# Patient Record
Sex: Female | Born: 1975 | Race: White | Hispanic: No | Marital: Married | Smoking: Never smoker
Health system: Southern US, Community
[De-identification: ages and names within clinical notes are randomized; demographics above are authoritative.]

## PROBLEM LIST (undated history)

## (undated) DIAGNOSIS — Z789 Other specified health status: Secondary | ICD-10-CM

## (undated) HISTORY — PX: NO PAST SURGERIES: SHX2092

---

## 2004-05-25 ENCOUNTER — Other Ambulatory Visit: Admission: RE | Admit: 2004-05-25 | Discharge: 2004-05-25 | Payer: Self-pay | Admitting: Obstetrics and Gynecology

## 2005-04-06 ENCOUNTER — Encounter: Admission: RE | Admit: 2005-04-06 | Discharge: 2005-04-06 | Payer: Self-pay | Admitting: Family Medicine

## 2005-09-06 ENCOUNTER — Other Ambulatory Visit: Admission: RE | Admit: 2005-09-06 | Discharge: 2005-09-06 | Payer: Self-pay | Admitting: Obstetrics and Gynecology

## 2006-09-12 ENCOUNTER — Other Ambulatory Visit: Admission: RE | Admit: 2006-09-12 | Discharge: 2006-09-12 | Payer: Self-pay | Admitting: Obstetrics & Gynecology

## 2007-03-24 ENCOUNTER — Encounter: Admission: RE | Admit: 2007-03-24 | Discharge: 2007-03-24 | Payer: Self-pay | Admitting: Family Medicine

## 2007-09-28 ENCOUNTER — Other Ambulatory Visit: Admission: RE | Admit: 2007-09-28 | Discharge: 2007-09-28 | Payer: Self-pay | Admitting: Obstetrics and Gynecology

## 2009-04-24 ENCOUNTER — Inpatient Hospital Stay (HOSPITAL_COMMUNITY): Admission: AD | Admit: 2009-04-24 | Discharge: 2009-04-27 | Payer: Self-pay | Admitting: Obstetrics and Gynecology

## 2010-09-29 ENCOUNTER — Ambulatory Visit (HOSPITAL_COMMUNITY): Admission: RE | Admit: 2010-09-29 | Discharge: 2010-09-29 | Payer: Self-pay | Admitting: Obstetrics and Gynecology

## 2010-10-05 ENCOUNTER — Ambulatory Visit (HOSPITAL_COMMUNITY): Admission: RE | Admit: 2010-10-05 | Discharge: 2010-10-05 | Payer: Self-pay | Admitting: Obstetrics and Gynecology

## 2010-11-03 ENCOUNTER — Ambulatory Visit (HOSPITAL_COMMUNITY)
Admission: RE | Admit: 2010-11-03 | Discharge: 2010-11-03 | Payer: Self-pay | Source: Home / Self Care | Admitting: Obstetrics and Gynecology

## 2011-03-23 LAB — CBC
HCT: 21.4 % — ABNORMAL LOW (ref 36.0–46.0)
HCT: 34.8 % — ABNORMAL LOW (ref 36.0–46.0)
Hemoglobin: 12.3 g/dL (ref 12.0–15.0)
Hemoglobin: 7.6 g/dL — CL (ref 12.0–15.0)
MCHC: 35.2 g/dL (ref 30.0–36.0)
MCHC: 35.3 g/dL (ref 30.0–36.0)
MCV: 92.6 fL (ref 78.0–100.0)
MCV: 93.3 fL (ref 78.0–100.0)
Platelets: 143 10*3/uL — ABNORMAL LOW (ref 150–400)
Platelets: 182 10*3/uL (ref 150–400)
RBC: 2.3 MIL/uL — ABNORMAL LOW (ref 3.87–5.11)
RBC: 3.76 MIL/uL — ABNORMAL LOW (ref 3.87–5.11)
RDW: 12.9 % (ref 11.5–15.5)
RDW: 13.1 % (ref 11.5–15.5)
WBC: 9 10*3/uL (ref 4.0–10.5)
WBC: 9.7 10*3/uL (ref 4.0–10.5)

## 2011-03-23 LAB — RPR: RPR Ser Ql: NONREACTIVE

## 2011-04-02 ENCOUNTER — Inpatient Hospital Stay (HOSPITAL_COMMUNITY)
Admission: RE | Admit: 2011-04-02 | Discharge: 2011-04-04 | DRG: 775 | Disposition: A | Discharge status: Home / Self Care | Payer: 59 | Source: Ambulatory Visit | Attending: Obstetrics and Gynecology | Admitting: Obstetrics and Gynecology

## 2011-04-02 LAB — CBC
HCT: 38 % (ref 36.0–46.0)
Hemoglobin: 12.7 g/dL (ref 12.0–15.0)
MCH: 30.2 pg (ref 26.0–34.0)
MCHC: 33.4 g/dL (ref 30.0–36.0)
MCV: 90.5 fL (ref 78.0–100.0)
Platelets: 174 10*3/uL (ref 150–400)
RDW: 13.1 % (ref 11.5–15.5)
WBC: 8.3 10*3/uL (ref 4.0–10.5)

## 2011-04-02 LAB — RPR: RPR Ser Ql: NONREACTIVE

## 2011-04-03 LAB — CBC
Hemoglobin: 11 g/dL — ABNORMAL LOW (ref 12.0–15.0)
MCHC: 32.8 g/dL (ref 30.0–36.0)
MCV: 90.8 fL (ref 78.0–100.0)
Platelets: 152 10*3/uL (ref 150–400)
RBC: 3.69 MIL/uL — ABNORMAL LOW (ref 3.87–5.11)
RDW: 13.1 % (ref 11.5–15.5)
WBC: 9.6 10*3/uL (ref 4.0–10.5)

## 2014-06-03 ENCOUNTER — Other Ambulatory Visit: Payer: Self-pay | Admitting: Family

## 2014-06-03 ENCOUNTER — Ambulatory Visit
Admission: RE | Admit: 2014-06-03 | Discharge: 2014-06-03 | Disposition: A | Discharge status: Home / Self Care | Payer: 59 | Source: Ambulatory Visit | Attending: Family | Admitting: Family

## 2014-06-03 DIAGNOSIS — M25512 Pain in left shoulder: Secondary | ICD-10-CM

## 2015-01-16 LAB — OB RESULTS CONSOLE RPR: RPR: NONREACTIVE

## 2015-01-16 LAB — OB RESULTS CONSOLE HEPATITIS B SURFACE ANTIGEN: Hepatitis B Surface Ag: NEGATIVE

## 2015-01-16 LAB — OB RESULTS CONSOLE RUBELLA ANTIBODY, IGM: Rubella: NON-IMMUNE/NOT IMMUNE

## 2015-01-16 LAB — OB RESULTS CONSOLE GC/CHLAMYDIA
CHLAMYDIA, DNA PROBE: NEGATIVE
Gonorrhea: NEGATIVE

## 2015-01-16 LAB — OB RESULTS CONSOLE ABO/RH: RH Type: POSITIVE

## 2015-01-16 LAB — OB RESULTS CONSOLE ANTIBODY SCREEN: Antibody Screen: NEGATIVE

## 2015-01-16 LAB — OB RESULTS CONSOLE HIV ANTIBODY (ROUTINE TESTING): HIV: NONREACTIVE

## 2015-05-01 ENCOUNTER — Inpatient Hospital Stay (HOSPITAL_COMMUNITY): Admission: AD | Admit: 2015-05-01 | Payer: Self-pay | Source: Ambulatory Visit | Admitting: Obstetrics and Gynecology

## 2015-08-15 ENCOUNTER — Encounter (HOSPITAL_COMMUNITY): Payer: Self-pay | Admitting: *Deleted

## 2015-08-15 ENCOUNTER — Telehealth (HOSPITAL_COMMUNITY): Payer: Self-pay | Admitting: *Deleted

## 2015-08-15 LAB — OB RESULTS CONSOLE GBS: GBS: NEGATIVE

## 2015-08-15 NOTE — Telephone Encounter (Signed)
Preadmission screen  

## 2015-08-21 ENCOUNTER — Encounter (HOSPITAL_COMMUNITY): Payer: Self-pay

## 2015-08-21 ENCOUNTER — Inpatient Hospital Stay (HOSPITAL_COMMUNITY)
Admission: AD | Admit: 2015-08-21 | Discharge: 2015-08-23 | DRG: 775 | Disposition: A | Discharge status: Home / Self Care | Payer: 59 | Source: Ambulatory Visit | Attending: Obstetrics and Gynecology | Admitting: Obstetrics and Gynecology

## 2015-08-21 DIAGNOSIS — Z809 Family history of malignant neoplasm, unspecified: Secondary | ICD-10-CM | POA: Diagnosis not present

## 2015-08-21 DIAGNOSIS — Z3A40 40 weeks gestation of pregnancy: Secondary | ICD-10-CM | POA: Diagnosis present

## 2015-08-21 DIAGNOSIS — Z8249 Family history of ischemic heart disease and other diseases of the circulatory system: Secondary | ICD-10-CM

## 2015-08-21 DIAGNOSIS — O480 Post-term pregnancy: Principal | ICD-10-CM | POA: Diagnosis present

## 2015-08-21 DIAGNOSIS — Z3490 Encounter for supervision of normal pregnancy, unspecified, unspecified trimester: Secondary | ICD-10-CM

## 2015-08-21 HISTORY — DX: Other specified health status: Z78.9

## 2015-08-21 LAB — CBC
HEMATOCRIT: 37.3 % (ref 36.0–46.0)
HEMOGLOBIN: 12.4 g/dL (ref 12.0–15.0)
MCH: 29.7 pg (ref 26.0–34.0)
MCHC: 33.2 g/dL (ref 30.0–36.0)
MCV: 89.2 fL (ref 78.0–100.0)
Platelets: 195 10*3/uL (ref 150–400)
RBC: 4.18 MIL/uL (ref 3.87–5.11)
RDW: 13.5 % (ref 11.5–15.5)
WBC: 8.9 10*3/uL (ref 4.0–10.5)

## 2015-08-21 LAB — TYPE AND SCREEN
ABO/RH(D): O POS
Antibody Screen: NEGATIVE

## 2015-08-21 LAB — RPR: RPR Ser Ql: NONREACTIVE

## 2015-08-21 LAB — ABO/RH: ABO/RH(D): O POS

## 2015-08-21 MED ORDER — ACETAMINOPHEN 325 MG PO TABS: 650.0000 mg | ORAL_TABLET | ORAL | Status: DC | PRN

## 2015-08-21 MED ORDER — SIMETHICONE 80 MG PO CHEW: 80.0000 mg | CHEWABLE_TABLET | ORAL | Status: DC | PRN

## 2015-08-21 MED ORDER — OXYTOCIN 40 UNITS IN LACTATED RINGERS INFUSION - SIMPLE MED
1.0000 m[IU]/min | INTRAVENOUS | Status: DC
  Administered 2015-08-21: 2 m[IU]/min via INTRAVENOUS

## 2015-08-21 MED ORDER — WITCH HAZEL-GLYCERIN EX PADS: 1.0000 "application " | MEDICATED_PAD | CUTANEOUS | Status: DC | PRN

## 2015-08-21 MED ORDER — ZOLPIDEM TARTRATE 5 MG PO TABS
5.0000 mg | ORAL_TABLET | Freq: Every evening | ORAL | Status: DC | PRN
Start: 2015-08-21 — End: 2015-08-23

## 2015-08-21 MED ORDER — DIPHENHYDRAMINE HCL 25 MG PO CAPS: 25.0000 mg | ORAL_CAPSULE | Freq: Four times a day (QID) | ORAL | Status: DC | PRN

## 2015-08-21 MED ORDER — LIDOCAINE HCL (PF) 1 % IJ SOLN
30.0000 mL | INTRAMUSCULAR | Status: DC | PRN
  Administered 2015-08-21: 30 mL via SUBCUTANEOUS
  Filled 2015-08-21: qty 30

## 2015-08-21 MED ORDER — OXYCODONE-ACETAMINOPHEN 5-325 MG PO TABS: 1.0000 | ORAL_TABLET | ORAL | Status: DC | PRN

## 2015-08-21 MED ORDER — ONDANSETRON HCL 4 MG/2ML IJ SOLN
4.0000 mg | INTRAMUSCULAR | Status: DC | PRN
Start: 2015-08-21 — End: 2015-08-23

## 2015-08-21 MED ORDER — SENNOSIDES-DOCUSATE SODIUM 8.6-50 MG PO TABS
2.0000 | ORAL_TABLET | ORAL | Status: DC
  Administered 2015-08-22 – 2015-08-23 (×2): 2 via ORAL
  Filled 2015-08-21 (×2): qty 2

## 2015-08-21 MED ORDER — TERBUTALINE SULFATE 1 MG/ML IJ SOLN
0.2500 mg | Freq: Once | INTRAMUSCULAR | Status: DC | PRN
Start: 2015-08-21 — End: 2015-08-21
  Filled 2015-08-21: qty 1

## 2015-08-21 MED ORDER — LANOLIN HYDROUS EX OINT: TOPICAL_OINTMENT | CUTANEOUS | Status: DC | PRN

## 2015-08-21 MED ORDER — DIBUCAINE 1 % RE OINT: 1.0000 "application " | TOPICAL_OINTMENT | RECTAL | Status: DC | PRN

## 2015-08-21 MED ORDER — BENZOCAINE-MENTHOL 20-0.5 % EX AERO
1.0000 "application " | INHALATION_SPRAY | CUTANEOUS | Status: DC | PRN
  Administered 2015-08-21: 1 via TOPICAL
  Filled 2015-08-21: qty 56

## 2015-08-21 MED ORDER — LACTATED RINGERS IV SOLN: 500.0000 mL | INTRAVENOUS | Status: DC | PRN

## 2015-08-21 MED ORDER — TETANUS-DIPHTH-ACELL PERTUSSIS 5-2.5-18.5 LF-MCG/0.5 IM SUSP: 0.5000 mL | Freq: Once | INTRAMUSCULAR | Status: DC

## 2015-08-21 MED ORDER — PRENATAL MULTIVITAMIN CH
1.0000 | ORAL_TABLET | Freq: Every day | ORAL | Status: DC
Start: 2015-08-21 — End: 2015-08-23
  Administered 2015-08-22 – 2015-08-23 (×2): 1 via ORAL
  Filled 2015-08-21 (×2): qty 1

## 2015-08-21 MED ORDER — ONDANSETRON HCL 4 MG/2ML IJ SOLN: 4.0000 mg | Freq: Four times a day (QID) | INTRAMUSCULAR | Status: DC | PRN

## 2015-08-21 MED ORDER — OXYTOCIN 40 UNITS IN LACTATED RINGERS INFUSION - SIMPLE MED
62.5000 mL/h | INTRAVENOUS | Status: DC
Start: 2015-08-21 — End: 2015-08-21
  Filled 2015-08-21: qty 1000

## 2015-08-21 MED ORDER — MEDROXYPROGESTERONE ACETATE 150 MG/ML IM SUSP: 150.0000 mg | INTRAMUSCULAR | Status: DC | PRN

## 2015-08-21 MED ORDER — OXYTOCIN 10 UNIT/ML IJ SOLN
INTRAMUSCULAR | Status: AC
  Filled 2015-08-21: qty 1

## 2015-08-21 MED ORDER — BUTORPHANOL TARTRATE 1 MG/ML IJ SOLN: 1.0000 mg | INTRAMUSCULAR | Status: DC | PRN

## 2015-08-21 MED ORDER — ONDANSETRON HCL 4 MG PO TABS
4.0000 mg | ORAL_TABLET | ORAL | Status: DC | PRN
Start: 2015-08-21 — End: 2015-08-23

## 2015-08-21 MED ORDER — IBUPROFEN 600 MG PO TABS
600.0000 mg | ORAL_TABLET | Freq: Four times a day (QID) | ORAL | Status: DC
  Administered 2015-08-21 – 2015-08-23 (×9): 600 mg via ORAL
  Filled 2015-08-21 (×9): qty 1

## 2015-08-21 MED ORDER — MEASLES, MUMPS & RUBELLA VAC ~~LOC~~ INJ: 0.5000 mL | INJECTION | Freq: Once | SUBCUTANEOUS | Status: DC

## 2015-08-21 MED ORDER — CITRIC ACID-SODIUM CITRATE 334-500 MG/5ML PO SOLN: 30.0000 mL | ORAL | Status: DC | PRN

## 2015-08-21 MED ORDER — OXYTOCIN BOLUS FROM INFUSION
500.0000 mL | INTRAVENOUS | Status: DC
Start: 2015-08-21 — End: 2015-08-21

## 2015-08-21 MED ORDER — OXYTOCIN 10 UNIT/ML IJ SOLN
INTRAMUSCULAR | Status: AC
  Administered 2015-08-21: 10 [IU] via INTRAMUSCULAR
  Filled 2015-08-21: qty 1

## 2015-08-21 MED ORDER — OXYCODONE-ACETAMINOPHEN 5-325 MG PO TABS
2.0000 | ORAL_TABLET | ORAL | Status: DC | PRN
Start: 2015-08-21 — End: 2015-08-23

## 2015-08-21 MED ORDER — OXYCODONE-ACETAMINOPHEN 5-325 MG PO TABS: 2.0000 | ORAL_TABLET | ORAL | Status: DC | PRN

## 2015-08-21 MED ORDER — LACTATED RINGERS IV SOLN: INTRAVENOUS | Status: DC

## 2015-08-21 NOTE — Lactation Note (Addendum)
This note was copied from the chart of Christina Wilkerson. Lactation Consultation Note initial visit at 11 hours of age.  Mom reports good feeding with a little pain with last latch.  Pacifier noted in baby's mouth.  Discussed reasons to not offer pacifier at this time. Mom has experience with breastfeeding 2 older children 1 year each. Baby asleep in crib.  [facility] LC resources given and discussed.  Encouraged to feed with early cues on demand.  Early newborn behavior discussed.  Hand expression demonstrated by mom with colostrum visible.  Mom to call for assist as needed.    Patient Name: Christina Wilkerson [id number] Date: 08/21/2015 Reason for consult: Initial assessment   Maternal Data Has patient been taught Hand Expression?: Yes Does the patient have breastfeeding experience prior to this delivery?: Yes  Feeding    LATCH Score/Interventions                Intervention(s): Breastfeeding basics reviewed     Lactation Tools Discussed/Used     Consult Status Consult Status: PRN    Jannifer Rodney 08/21/2015, 10:46 PM

## 2015-08-21 NOTE — Progress Notes (Signed)
Called to pt room, c/c/+2 station Vertex crowning after pushing w/ 2 contractions After delivery of head, shoulder dystocia suspected  Pt not cooperative with maneuvers - clinched legs & buttocks while pulling up bed Shoulder and body delivered with help of nurses pulling back on legs and suprapubic pressure Placenta delivered spontaneous w/ 3VC 1st degree laceration repaired with 3-0 vicryl rapide Mom and baby stable - couplet care EBL approx 200cc, pending final RN eval

## 2015-08-21 NOTE — H&P (Signed)
Christina Wilkerson is a 39 y.o. female presenting for postdates IOL.  Pregnancy uncomplicated. \ History OB History    Gravida Para Term Preterm AB TAB SAB Ectopic Multiple Living   Past Medical History  Diagnosis Date  . Medical history non-contributory    Past Surgical History  Procedure Laterality Date  . No past surgeries     Family History: family history includes Cancer in her paternal grandfather; Heart attack in her father. Social History:  reports that she has never smoked. She does not have any smokeless tobacco history on file. She reports that she does not drink alcohol or use illicit drugs.   Prenatal Transfer Tool  Maternal Diabetes: No Genetic Screening: Normal Maternal Ultrasounds/Referrals: Normal Fetal Ultrasounds or other Referrals:  None Maternal Substance Abuse:  No Significant Maternal Medications:  None Significant Maternal Lab Results:  None Other Comments:  None  ROS  Dilation: 3 Exam by:: Dr. Renaldo Fiddler Blood pressure 120/83, pulse 96, temperature 97.9 F (36.6 C), temperature source Oral, resp. rate 18, height 5' 3.5" (1.613 m), weight 158 lb (71.668 kg), last menstrual period 11/11/2014. Exam Physical Exam  gen - NAD Abd - gravid, NT Ext - NT, no edema Pv - 3cm attempted AROM, no fluid return Prenatal labs: ABO, Rh: O/Positive/-- (02/04 0000) Antibody: Negative (02/04 0000) Rubella: Nonimmune (02/04 0000) RPR: Nonreactive (02/04 0000)  HBsAg: Negative (02/04 0000)  HIV: Non-reactive (02/04 0000)  GBS: Negative (09/02 0000)   Assessment/Plan: Admit Pitocin Exp mngt   Jareb Radoncic 08/21/2015, 8:09 AM

## 2015-08-22 LAB — CBC
HEMATOCRIT: 31.6 % — AB (ref 36.0–46.0)
Hemoglobin: 10.8 g/dL — ABNORMAL LOW (ref 12.0–15.0)
MCH: 30.4 pg (ref 26.0–34.0)
MCHC: 34.2 g/dL (ref 30.0–36.0)
MCV: 89 fL (ref 78.0–100.0)
PLATELETS: 163 10*3/uL (ref 150–400)
RBC: 3.55 MIL/uL — ABNORMAL LOW (ref 3.87–5.11)
RDW: 13.7 % (ref 11.5–15.5)
WBC: 10 10*3/uL (ref 4.0–10.5)

## 2015-08-22 MED ORDER — MEASLES, MUMPS & RUBELLA VAC ~~LOC~~ INJ
0.5000 mL | INJECTION | Freq: Once | SUBCUTANEOUS | Status: AC
  Administered 2015-08-23: 0.5 mL via SUBCUTANEOUS
  Filled 2015-08-22: qty 0.5

## 2015-08-22 NOTE — Progress Notes (Signed)
Post Partum Day 1 Subjective: no complaints, up ad lib, voiding and tolerating PO  Objective: Blood pressure 110/64, pulse 67, temperature 97.9 F (36.6 C), temperature source Oral, resp. rate 18, height 5' 3.5" (1.613 m), weight 158 lb (71.668 kg), last menstrual period 11/11/2014, SpO2 100 %, unknown if currently breastfeeding.  Physical Exam:  General: alert and cooperative Lochia: appropriate Uterine Fundus: firm Incision: healing well DVT Evaluation: No evidence of DVT seen on physical exam. Negative Homan's sign. No cords or calf tenderness. No significant calf/ankle edema.   Recent Labs  08/21/15 0820 08/22/15 0615  HGB 12.4 10.8*  HCT 37.3 31.6*    Assessment/Plan: Plan for discharge tomorrow and Circumcision prior to discharge   LOS: 1 day   CURTIS,CAROL G 08/22/2015, 7:01 AM

## 2015-08-23 NOTE — Lactation Note (Signed)
This note was copied from the chart of Christina Wilkerson. Lactation Consultation Note  Ex BF P3 but mother needed reminders about feeding baby. Baby crying for over half an hour.  Mother stated baby recently breastfed for 50 min.  Discussed cluster feeding.   Encouraged mother to feed baby.  She latched baby and he seemed content.  Sucks and some swallows observed. Mother's nipples pink.  Provided her w/ comfort gels.  Discussed applying ebm. Reviewed engorgement care and monitoring voids/stools.    Patient Name: Christina Wilkerson Date: 08/23/2015 Reason for consult: Follow-up assessment   Maternal Data    Feeding Feeding Type: Breast Fed Length of feed: 50 min  LATCH Score/Interventions Latch: Grasps breast easily, tongue down, lips flanged, rhythmical sucking.  Audible Swallowing: A few with stimulation  Type of Nipple: Everted at rest and after stimulation  Comfort (Breast/Nipple): Filling, red/small blisters or bruises, mild/mod discomfort  Problem noted: Mild/Moderate discomfort Interventions (Mild/moderate discomfort): Hand expression;Comfort gels  Hold (Positioning): No assistance needed to correctly position infant at breast.  LATCH Score: 8  Lactation Tools Discussed/Used     Consult Status Consult Status: Complete    Hardie Pulley 08/23/2015, 8:35 AM

## 2015-08-23 NOTE — Discharge Summary (Signed)
Obstetric Discharge Summary Reason for Admission: induction of labor Prenatal Procedures: none Intrapartum Procedures: spontaneous vaginal delivery Postpartum Procedures: none Complications-Operative and Postpartum: 1st degree perineal laceration HEMOGLOBIN  Date Value Ref Range Status  08/22/2015 10.8* 12.0 - 15.0 g/dL Final   HCT  Date Value Ref Range Status  08/22/2015 31.6* 36.0 - 46.0 % Final    Physical Exam:  General: alert, cooperative, appears stated age and no distress Lochia: appropriate Uterine Fundus: firm Incision: healing well DVT Evaluation: No evidence of DVT seen on physical exam.  Discharge Diagnoses: Term Pregnancy-delivered  Discharge Information: Date: 08/23/2015 Activity: pelvic rest Diet: routine Medications: None Condition: stable Instructions: refer to practice specific booklet Discharge to: home   Newborn Data: Live born female  Birth Weight: 8 lb 13.8 oz (4020 g) APGAR: 8, 9  Home with mother.  Saed Hudlow C 08/23/2015, 8:38 AM

## 2015-09-11 ENCOUNTER — Ambulatory Visit (HOSPITAL_COMMUNITY)
Admission: RE | Admit: 2015-09-11 | Discharge: 2015-09-11 | Disposition: A | Discharge status: Home / Self Care | Payer: 59 | Source: Ambulatory Visit | Attending: Obstetrics and Gynecology | Admitting: Obstetrics and Gynecology

## 2015-09-11 NOTE — Lactation Note (Signed)
Lactation Consult  Mother's reason for visit:  Sore nipples Visit Type:  Outpatient - Feeding assessment, sore nipples Appointment Notes:  Mom reports that she has had continued pain with breastfeeding since baby was born. She started [facility] 3 weeks ago which has helped. However with nursing Mom continues to report PS of 8-9 with latch in the mornings. This improves later in the day to PS of 5. Mom reports crease across nipples bilateral with nursing, the right nipple is cracked and has bleeding with nursing. She would like help with latch due to discomfort. Mom is experienced BF with 2 older children BF for 1 year each. Baby Emmitt is now 33 weeks old.  Consult:  Initial Lactation Consultant:  Alfred Levins  ________________________________________________________________________   Baby's Name: Eugenia Mcalpine Date of Birth: 08/21/2013 Pediatrician: [facility] - [place], and [place]  - Dr. Sharol Roussel is Emmitt's Peds Gender: female Gestational Age: [redacted]w[redacted]d (At Birth) Birth Weight: 8 lb 13.8 oz (4020 g) Weight at Discharge: Weight: 8 lb 5.9 oz (3795 g)Date of Discharge: 08/23/2015 Filed Weights   08/21/15 1137 08/22/15 0008 08/23/15 0000  Weight: 8 lb 13.8 oz (4020 g) 8 lb 12 oz (3969 g) 8 lb 5.9 oz (3795 g)   Last weight taken from location outside of [facility]: 09/08/15  8 lb. 13 oz Location:Pediatrician's office Weight today: 4192 gm/ 9 lb. 3.9 oz.      ________________________________________________________________________  Mother's Name: Cyd Silence Type of delivery:  SVB Breastfeeding Experience:  P3, BF 2 older children for 1 year Maternal Medical Conditions:  None Reported Maternal Medications: PNV, APNC  ________________________________________________________________________  Breastfeeding History (Post Discharge)  Frequency of breastfeeding:  Every 2-3 hours Duration of feeding:  20-30 minutes, both breasts most  feedings.   Patient does not supplement or pump.  Infant Intake and Output Assessment  Voids:  10+ in 24 hrs.  Color:  Clear yellow Stools:  5-6 in 24 hrs.  Color:  Yellow/Seedy  ________________________________________________________________________  Maternal Breast Assessment  Breast:  Soft Nipple:  Erect with scabs across both nipples, worse on right nipple. Aerola is red, inflamed.  Pain level:  8-9 Pain interventions:  All purpose nipple cream  _______________________________________________________________________ Feeding Assessment/Evaluation  Initial feeding assessment:  Infant's oral assessment:  Variance. LC notes thick, labial frenulum along with short posterior, lingual frenulum. Baby has heart shape to tongue with crying. On suck exam, lower gum ridge rubs LC finger. Sucking callous' along baby's lower lip.   Positioning:  Cradle Left breast  LATCH documentation: With latching in cradle hold, baby initially was shallow with latch, chewing to obtain depth. Once baby obtained good depth and lips were un-tucked baby demonstrated a good suckling rhythm. Mom's PS with initial latch was 5 improving to 3 as baby nursed. Good swallows noted. Nutritive suckling pattern observed. When baby came off the breast, compression line visible across Mom's nipple in spite of what appeared to be a good latch.   Pre-feed weight:  4192 g  (9 lb. 3.9 oz.) Post-feed weight:  4270 g (9 lb. 6.6 oz.) Amount transferred:  78 ml with nursing for 15 minutes.   Additional Feeding Assessment -   Infant's oral assessment:  Variance - See Above  Positioning:  Cross cradle Right breast  LATCH documentation: Using cross cradle hold, baby was a little fussy but was able to obtain more depth with initial latch. PS with initial latch was 9-10. Compression line visible across nipple at scabbed area, no bleeding.  This did not improve after baby was nursing for 2-3 minutes. Initiated #24 nipple  shield, after few minutes baby obtained good depth and demonstrated a good nutritive suckling pattern. PS improved to 5 then 3 using the nipple shield. Mom's nipple was round when baby came off the breast, no bleeding observed. Lips had to be un-tucked with initial latch.   Tools:  Nipple shield 24 mm Instructed on use and cleaning of tool:  Yes.    Pre-feed weight:  4270 g  (9 lb. 6.6 oz.) Post-feed weight:  4296 g (9 lb. 7.5 oz.) Amount transferred:  26 ml with nursing for 15-20 minutes  Total amount transferred:  104 ml  Baby is transferring milk well at the breast in spite of painful latch. LC feels the pain with nursing is due to the short, posterior lingual frenulum and labial frenulum.  Baby's lower gum was rubbing against LC finger with suck exam and Mom reports biting, chewing sensations at the breast, worse towards the end of the feeding. Mom reports continued compression line visible on her nipples. Baby is not consistently keeping his lips untucked with the feeding. Sucking callous' noted along lower lip.  Encouraged Mom to use cross cradle with latching baby instead of cradle hold so baby will not obtain shallow latch at the beginning of the feeding. Mom does not report burning pain with nursing now but did have symptoms of possible yeast at her breast prior to starting [facility]. This has improved. LC advised if does not continue to improve contact OB, may need Diflucan. NO evidence of yeast observed at this visit in Baby Emmitt's mouth.  Information given to research "tongue-tie", (Dr. Lockie Mola web site, Dr. Tretha Sciara contact information and web site). Advised to use nipple shield to latch till nipples heal. May need to pre-pump to get flow moving if baby fussy using the nipple shield. Be sure to see breast milk in the nipple shield.  OP lactation f/u scheduled for Friday, 09/19/15 at 0900.

## 2015-09-19 ENCOUNTER — Ambulatory Visit (HOSPITAL_COMMUNITY)
Admission: RE | Admit: 2015-09-19 | Discharge: 2015-09-19 | Disposition: A | Discharge status: Home / Self Care | Payer: 59 | Source: Ambulatory Visit | Attending: Obstetrics and Gynecology | Admitting: Obstetrics and Gynecology

## 2015-09-19 NOTE — Lactation Note (Addendum)
Lactation Consult  Mother's reason for visit: follow up for sore nipples and feeding assessment Visit Type: feeding assessment   Consult:  Follow-Up Lactation Consultant:  Stevan Born McCoy  ________________________________________________________________________  Christina Wilkerson has a 12 ounce gain in one week. Mother was given lots of support and praise  ________________________________________________________________________  Mother's Name: Christina Wilkerson Type of delivery:  vaginal del Breastfeeding Experience:  1 year with two other children Maternal Medical Conditions:  none Maternal Medications: Prenatal vits  ________________________________________________________________________  Breastfeeding History (Post Discharge)  Frequency of breastfeeding:  2-3 hours Duration of feeding: 20-30 total feeding    Infant Intake and Output Assessment  Voids:  8-10 in 24 hrs.  Color:  Clear yellow Stools:  5-6 in 24 hrs.  Color:  Yellow  ________________________________________________________________________  Maternal Breast Assessment  Breast:  Full Nipple:  Erect Pain level:  1 Pain interventions:  Bra and All purpose nipple cream  _______________________________________________________________________ Feeding Assessment/Evaluation: Mother latched infant on the left breast. Observed a shallow latch. Infants lips pursed. He has a difficult time sustaining the latch. Infant pops on and off but observed good milk transfer.   Initial feeding assessment:  Mother has been using a #24 nipple shield on the rt nipple due to painful nipples. Mother has also been using APNO for her nipples and they are much better. Her nipples are still slighly pink. She describes slight pain on the initial latch.  Mother offered the alternate breast and infant sustained latch for 15 mins without popping on and off.   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  Audible swallowing:  2 = Spontaneous and intermittent  Type of nipple:  2 = Everted at rest and after stimulation  Comfort (Breast/Nipple):  1 = Filling, red/small blisters or bruises, mild/mod discomfort  Hold (Positioning):  1 = Assistance needed to correctly position infant at breast and maintain latch  LATCH score:  8  Attached assessment:  Deep  Lips flanged:  Yes.  assist with flanging infants lips  Lips untucked:  Yes.    Suck assessment:  Nutritive     Pre-feed weight: 4496,9-14.6   Post-feed weight: 4562, 10-09  Amount transferred:  66 ml  Alternate breast  Pre-weight: 4562  Post weight: 4588 Amt transferred, 26 ml   Total amount transferred:  66 ml from left breast and 26 ml from Rt breast.  Mother advised to continue to breastfeed infant on cue Suggested nipple to nose technique for deeper latch Discussed protecting her nipples and her milk supply Follow up with Specialist for revision Discussed the use of stretching exercises after procedure.  Advised mother to follow up with [name] after procedure for feeding assessment.

## 2016-01-10 IMAGING — CR DG SHOULDER 2+V*L*
3 series · 3 of 3 positions shown · non-contrast
Comparison: None.

CLINICAL DATA: Left shoulder pain

EXAM:
LEFT SHOULDER - 2+ VIEW

[view not recorded (1 of 3)]
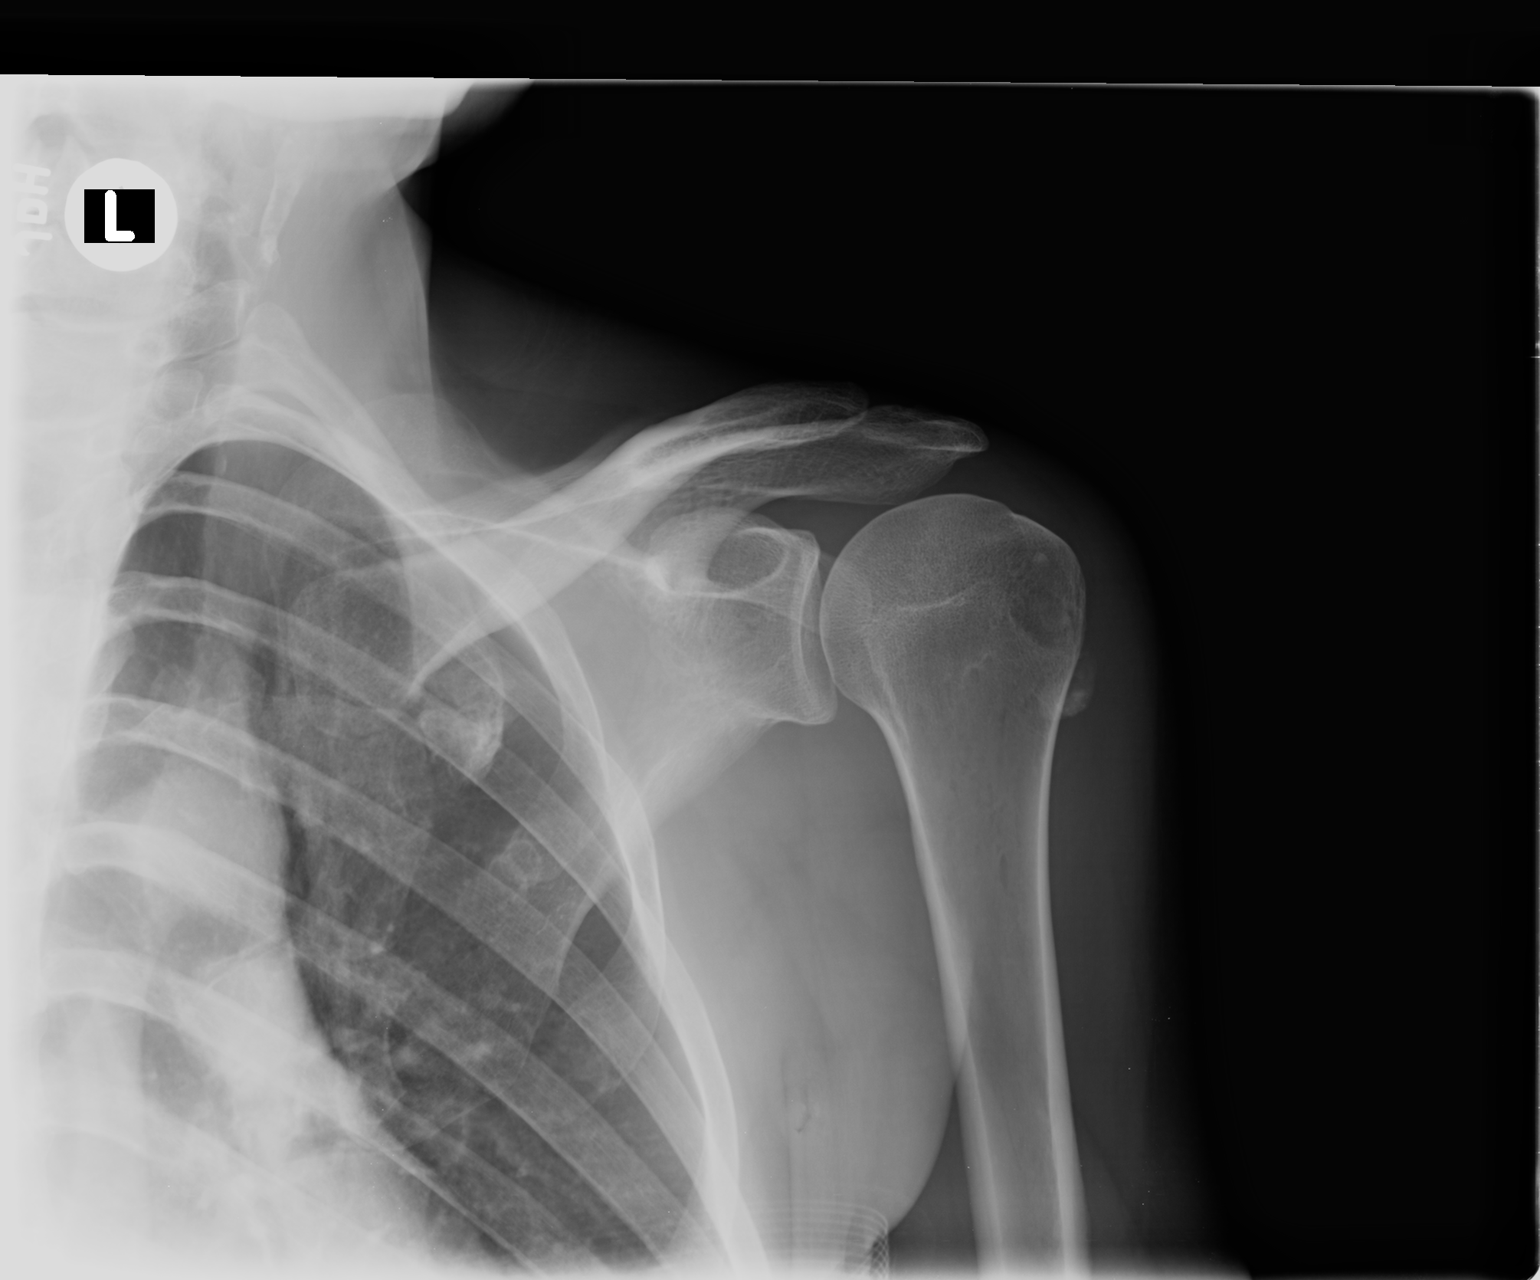

[view not recorded (2 of 3)]
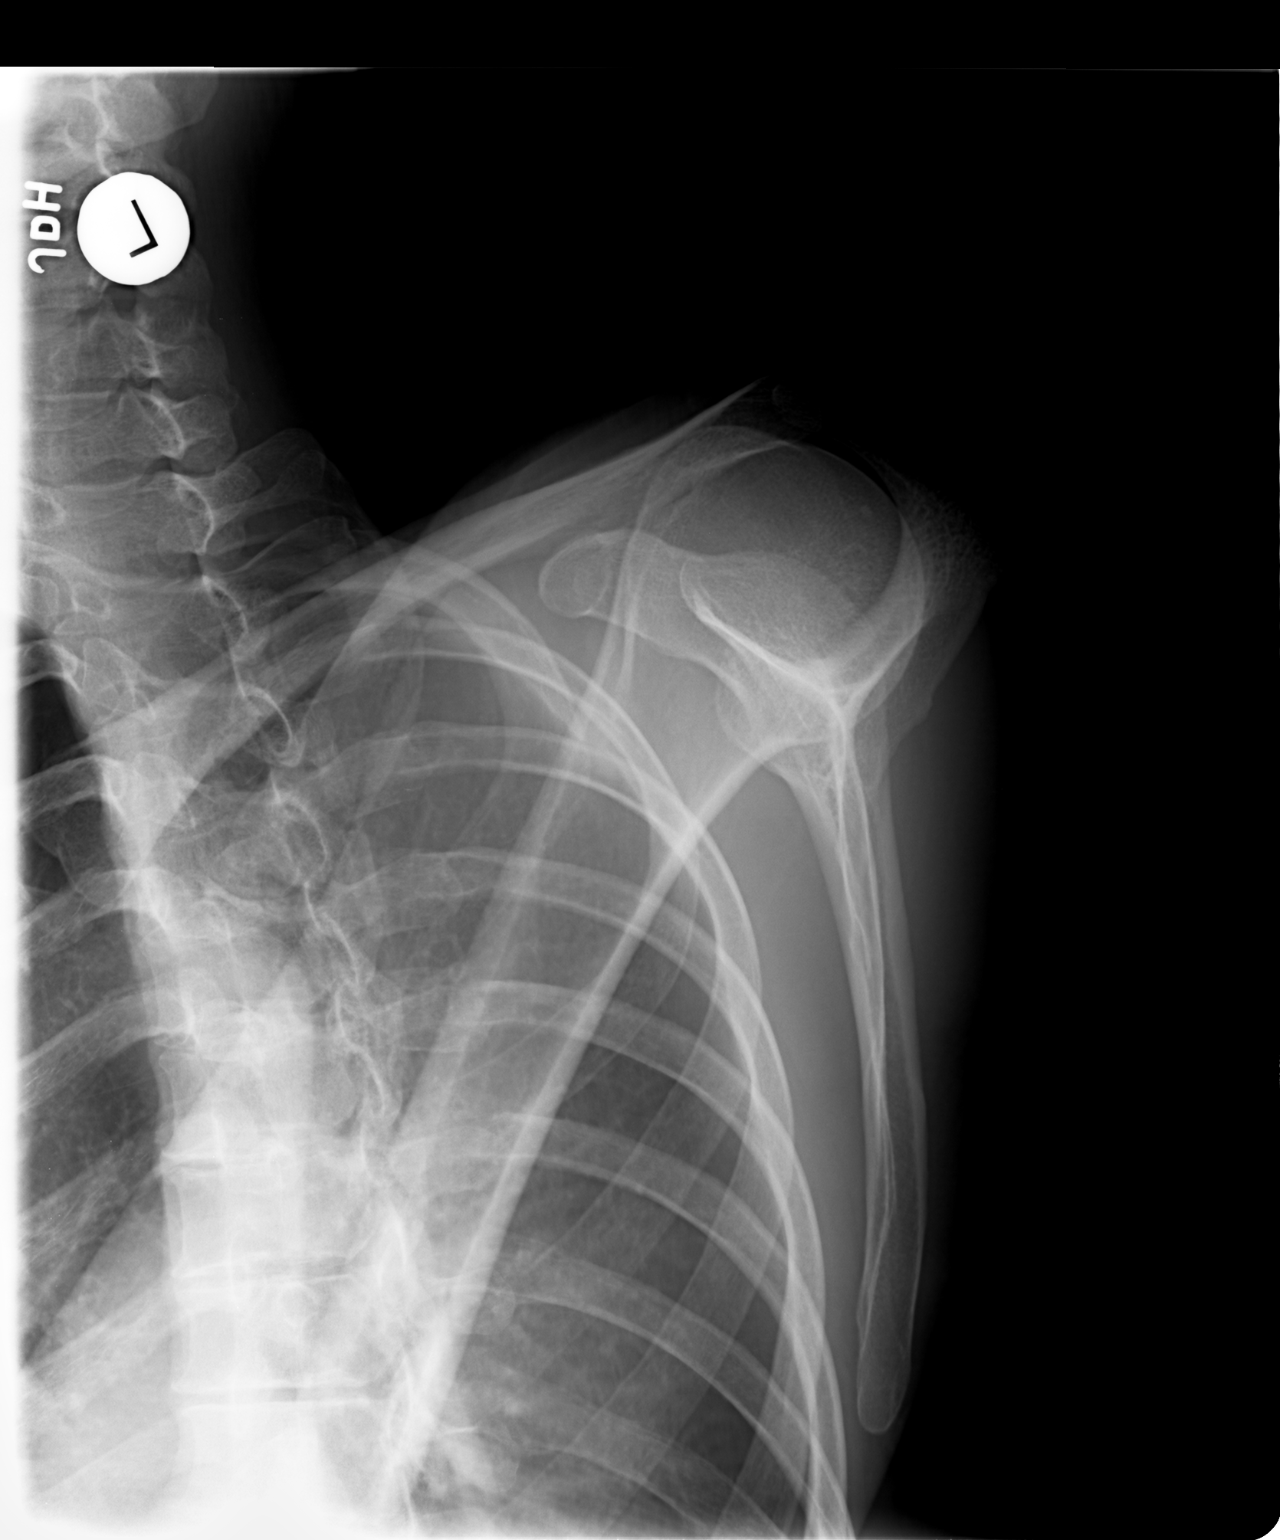

[view not recorded (3 of 3)]
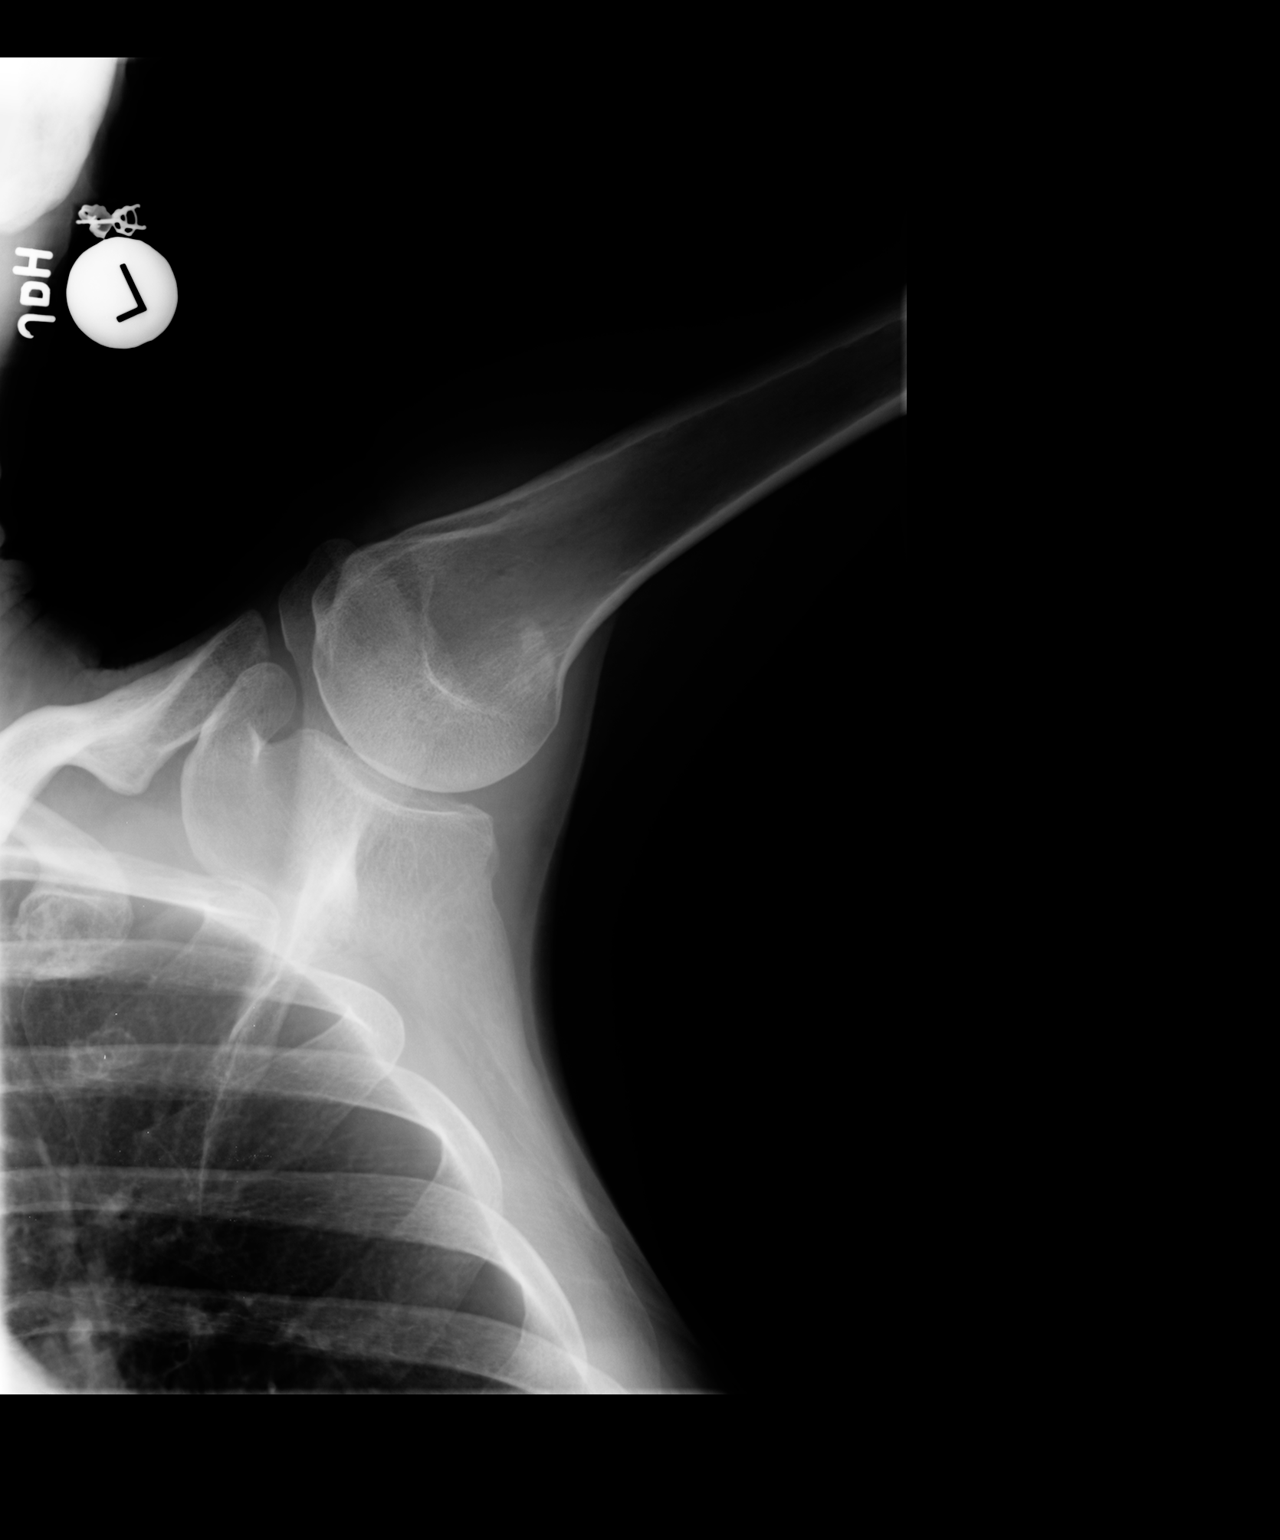

[3 of 3 positions shown; findings below may reference images not displayed]

FINDINGS: No acute fracture or dislocation is noted. No gross soft tissue
abnormality is seen. Bony densities noted adjacent to the humeral
head laterally. This may be related to calcification the joint
capsule or possibly associated with adjacent ligaments.
IMPRESSION: No acute abnormality noted.

## 2017-01-31 LAB — OB RESULTS CONSOLE RUBELLA ANTIBODY, IGM: RUBELLA: IMMUNE

## 2017-01-31 LAB — OB RESULTS CONSOLE ABO/RH: RH Type: POSITIVE

## 2017-01-31 LAB — OB RESULTS CONSOLE RPR: RPR: NONREACTIVE

## 2017-01-31 LAB — OB RESULTS CONSOLE GC/CHLAMYDIA
CHLAMYDIA, DNA PROBE: NEGATIVE
GC PROBE AMP, GENITAL: NEGATIVE

## 2017-01-31 LAB — OB RESULTS CONSOLE HEPATITIS B SURFACE ANTIGEN: Hepatitis B Surface Ag: NEGATIVE

## 2017-01-31 LAB — OB RESULTS CONSOLE ANTIBODY SCREEN: Antibody Screen: NEGATIVE

## 2017-01-31 LAB — OB RESULTS CONSOLE HIV ANTIBODY (ROUTINE TESTING): HIV: NONREACTIVE

## 2017-03-24 ENCOUNTER — Other Ambulatory Visit (HOSPITAL_COMMUNITY): Payer: Self-pay | Admitting: Obstetrics and Gynecology

## 2017-03-24 DIAGNOSIS — Z3689 Encounter for other specified antenatal screening: Secondary | ICD-10-CM

## 2017-03-24 DIAGNOSIS — O09522 Supervision of elderly multigravida, second trimester: Secondary | ICD-10-CM

## 2017-03-24 DIAGNOSIS — Z3A18 18 weeks gestation of pregnancy: Secondary | ICD-10-CM

## 2017-04-06 ENCOUNTER — Encounter (HOSPITAL_COMMUNITY): Payer: Self-pay

## 2017-04-06 ENCOUNTER — Ambulatory Visit (HOSPITAL_COMMUNITY): Payer: 59

## 2017-09-01 ENCOUNTER — Encounter (HOSPITAL_COMMUNITY): Payer: Self-pay | Admitting: *Deleted

## 2017-09-01 ENCOUNTER — Telehealth (HOSPITAL_COMMUNITY): Payer: Self-pay | Admitting: *Deleted

## 2017-09-01 LAB — OB RESULTS CONSOLE GBS: STREP GROUP B AG: NEGATIVE

## 2017-09-02 ENCOUNTER — Telehealth (HOSPITAL_COMMUNITY): Payer: Self-pay | Admitting: *Deleted

## 2017-09-02 NOTE — Telephone Encounter (Signed)
Preadmission screen  

## 2017-09-04 ENCOUNTER — Encounter (HOSPITAL_COMMUNITY): Payer: Self-pay | Admitting: Anesthesiology

## 2017-09-04 ENCOUNTER — Encounter (HOSPITAL_COMMUNITY): Payer: Self-pay | Admitting: *Deleted

## 2017-09-04 ENCOUNTER — Inpatient Hospital Stay (HOSPITAL_COMMUNITY)
Admission: AD | Admit: 2017-09-04 | Discharge: 2017-09-06 | DRG: 775 | Disposition: A | Discharge status: Home / Self Care | Payer: 59 | Source: Ambulatory Visit | Attending: Obstetrics and Gynecology | Admitting: Obstetrics and Gynecology

## 2017-09-04 DIAGNOSIS — Z3490 Encounter for supervision of normal pregnancy, unspecified, unspecified trimester: Secondary | ICD-10-CM

## 2017-09-04 DIAGNOSIS — Z3A40 40 weeks gestation of pregnancy: Secondary | ICD-10-CM | POA: Diagnosis not present

## 2017-09-04 DIAGNOSIS — O26893 Other specified pregnancy related conditions, third trimester: Secondary | ICD-10-CM | POA: Diagnosis present

## 2017-09-04 LAB — CBC
HCT: 36.6 % (ref 36.0–46.0)
Hemoglobin: 12.7 g/dL (ref 12.0–15.0)
MCH: 30.6 pg (ref 26.0–34.0)
MCHC: 34.7 g/dL (ref 30.0–36.0)
MCV: 88.2 fL (ref 78.0–100.0)
PLATELETS: 194 10*3/uL (ref 150–400)
RBC: 4.15 MIL/uL (ref 3.87–5.11)
RDW: 13.3 % (ref 11.5–15.5)
WBC: 7.3 10*3/uL (ref 4.0–10.5)

## 2017-09-04 LAB — TYPE AND SCREEN
ABO/RH(D): O POS
Antibody Screen: NEGATIVE

## 2017-09-04 LAB — RPR: RPR: NONREACTIVE

## 2017-09-04 LAB — POCT FERN TEST: POCT FERN TEST: POSITIVE

## 2017-09-04 MED ORDER — LACTATED RINGERS IV SOLN: 500.0000 mL | INTRAVENOUS | Status: DC | PRN

## 2017-09-04 MED ORDER — DIBUCAINE 1 % RE OINT: 1.0000 "application " | TOPICAL_OINTMENT | RECTAL | Status: DC | PRN

## 2017-09-04 MED ORDER — PHENYLEPHRINE 40 MCG/ML (10ML) SYRINGE FOR IV PUSH (FOR BLOOD PRESSURE SUPPORT)
80.0000 ug | PREFILLED_SYRINGE | INTRAVENOUS | Status: DC | PRN
Start: 2017-09-04 — End: 2017-09-04
  Filled 2017-09-04: qty 10

## 2017-09-04 MED ORDER — IBUPROFEN 600 MG PO TABS
600.0000 mg | ORAL_TABLET | Freq: Four times a day (QID) | ORAL | Status: DC
  Administered 2017-09-04 – 2017-09-06 (×9): 600 mg via ORAL
  Filled 2017-09-04 (×9): qty 1

## 2017-09-04 MED ORDER — ONDANSETRON HCL 4 MG/2ML IJ SOLN: 4.0000 mg | Freq: Four times a day (QID) | INTRAMUSCULAR | Status: DC | PRN

## 2017-09-04 MED ORDER — LIDOCAINE HCL (PF) 1 % IJ SOLN
30.0000 mL | INTRAMUSCULAR | Status: AC | PRN
  Administered 2017-09-04: 30 mL via SUBCUTANEOUS
  Filled 2017-09-04: qty 30

## 2017-09-04 MED ORDER — EPHEDRINE 5 MG/ML INJ: 10.0000 mg | INTRAVENOUS | Status: DC | PRN

## 2017-09-04 MED ORDER — ACETAMINOPHEN 325 MG PO TABS: 650.0000 mg | ORAL_TABLET | ORAL | Status: DC | PRN

## 2017-09-04 MED ORDER — OXYCODONE-ACETAMINOPHEN 5-325 MG PO TABS: 2.0000 | ORAL_TABLET | ORAL | Status: DC | PRN

## 2017-09-04 MED ORDER — DIPHENHYDRAMINE HCL 50 MG/ML IJ SOLN: 12.5000 mg | INTRAMUSCULAR | Status: DC | PRN

## 2017-09-04 MED ORDER — MEDROXYPROGESTERONE ACETATE 150 MG/ML IM SUSP: 150.0000 mg | INTRAMUSCULAR | Status: DC | PRN

## 2017-09-04 MED ORDER — EPHEDRINE 5 MG/ML INJ
10.0000 mg | INTRAVENOUS | Status: DC | PRN
Start: 2017-09-04 — End: 2017-09-04

## 2017-09-04 MED ORDER — MEASLES, MUMPS & RUBELLA VAC ~~LOC~~ INJ: 0.5000 mL | INJECTION | Freq: Once | SUBCUTANEOUS | Status: DC

## 2017-09-04 MED ORDER — ONDANSETRON HCL 4 MG/2ML IJ SOLN: 4.0000 mg | INTRAMUSCULAR | Status: DC | PRN

## 2017-09-04 MED ORDER — ONDANSETRON HCL 4 MG PO TABS: 4.0000 mg | ORAL_TABLET | ORAL | Status: DC | PRN

## 2017-09-04 MED ORDER — DIPHENHYDRAMINE HCL 25 MG PO CAPS: 25.0000 mg | ORAL_CAPSULE | Freq: Four times a day (QID) | ORAL | Status: DC | PRN

## 2017-09-04 MED ORDER — OXYCODONE-ACETAMINOPHEN 5-325 MG PO TABS: 1.0000 | ORAL_TABLET | ORAL | Status: DC | PRN

## 2017-09-04 MED ORDER — LACTATED RINGERS IV SOLN
INTRAVENOUS | Status: DC
  Administered 2017-09-04: 05:00:00 via INTRAVENOUS

## 2017-09-04 MED ORDER — PRENATAL MULTIVITAMIN CH
1.0000 | ORAL_TABLET | Freq: Every day | ORAL | Status: DC
  Administered 2017-09-04 – 2017-09-05 (×2): 1 via ORAL
  Filled 2017-09-04 (×2): qty 1

## 2017-09-04 MED ORDER — WITCH HAZEL-GLYCERIN EX PADS: 1.0000 "application " | MEDICATED_PAD | CUTANEOUS | Status: DC | PRN

## 2017-09-04 MED ORDER — SOD CITRATE-CITRIC ACID 500-334 MG/5ML PO SOLN: 30.0000 mL | ORAL | Status: DC | PRN

## 2017-09-04 MED ORDER — FLEET ENEMA 7-19 GM/118ML RE ENEM: 1.0000 | ENEMA | RECTAL | Status: DC | PRN

## 2017-09-04 MED ORDER — OXYTOCIN BOLUS FROM INFUSION
500.0000 mL | Freq: Once | INTRAVENOUS | Status: AC
  Administered 2017-09-04: 500 mL via INTRAVENOUS

## 2017-09-04 MED ORDER — SENNOSIDES-DOCUSATE SODIUM 8.6-50 MG PO TABS
2.0000 | ORAL_TABLET | ORAL | Status: DC
  Administered 2017-09-05 (×2): 2 via ORAL
  Filled 2017-09-04 (×2): qty 2

## 2017-09-04 MED ORDER — OXYCODONE-ACETAMINOPHEN 5-325 MG PO TABS
1.0000 | ORAL_TABLET | ORAL | Status: DC | PRN
Start: 2017-09-04 — End: 2017-09-06

## 2017-09-04 MED ORDER — TETANUS-DIPHTH-ACELL PERTUSSIS 5-2.5-18.5 LF-MCG/0.5 IM SUSP: 0.5000 mL | Freq: Once | INTRAMUSCULAR | Status: DC

## 2017-09-04 MED ORDER — COCONUT OIL OIL: 1.0000 "application " | TOPICAL_OIL | Status: DC | PRN

## 2017-09-04 MED ORDER — PHENYLEPHRINE 40 MCG/ML (10ML) SYRINGE FOR IV PUSH (FOR BLOOD PRESSURE SUPPORT): 80.0000 ug | PREFILLED_SYRINGE | INTRAVENOUS | Status: DC | PRN

## 2017-09-04 MED ORDER — OXYTOCIN 40 UNITS IN LACTATED RINGERS INFUSION - SIMPLE MED
2.5000 [IU]/h | INTRAVENOUS | Status: DC
  Administered 2017-09-04: 2.5 [IU]/h via INTRAVENOUS
  Filled 2017-09-04: qty 1000

## 2017-09-04 MED ORDER — OXYCODONE-ACETAMINOPHEN 5-325 MG PO TABS
2.0000 | ORAL_TABLET | ORAL | Status: DC | PRN
Start: 2017-09-04 — End: 2017-09-06

## 2017-09-04 MED ORDER — SIMETHICONE 80 MG PO CHEW: 80.0000 mg | CHEWABLE_TABLET | ORAL | Status: DC | PRN

## 2017-09-04 MED ORDER — FENTANYL 2.5 MCG/ML BUPIVACAINE 1/10 % EPIDURAL INFUSION (WH - ANES)
14.0000 mL/h | INTRAMUSCULAR | Status: DC | PRN
  Filled 2017-09-04: qty 100

## 2017-09-04 MED ORDER — BENZOCAINE-MENTHOL 20-0.5 % EX AERO: 1.0000 "application " | INHALATION_SPRAY | CUTANEOUS | Status: DC | PRN

## 2017-09-04 MED ORDER — LACTATED RINGERS IV SOLN: 500.0000 mL | Freq: Once | INTRAVENOUS | Status: DC

## 2017-09-04 NOTE — MAU Note (Signed)
SROM at 0300. Clear fld. 4cm last sve and hx rapid labor.

## 2017-09-04 NOTE — Lactation Note (Signed)
This note was copied from a baby's chart. Lactation Consultation Note  Patient Name: Boy Cassy Sprowl ZOXWR'U Date: 09/04/2017 Reason for consult: Initial assessment Baby at 9 hr of life. Upon entry "big brother" was holding baby. Family was spending time bonding. Experienced bf mom reports latching is going "well". She denies breast or nipple pain, voiced no concerns. Left lactation handouts and instructions for mom to call for lactation at the next feeding. No bf education was done at this visit.   Maternal Data    Feeding    LATCH Score                   Interventions    Lactation Tools Discussed/Used     Consult Status Consult Status: Follow-up Date: 09/05/17 Follow-up type: In-patient    Rulon Eisenmenger 09/04/2017, 3:57 PM

## 2017-09-04 NOTE — Anesthesia Preprocedure Evaluation (Deleted)
Anesthesia Evaluation  Patient identified by MRN, date of birth, ID band Patient awake    Reviewed: Allergy & Precautions, Patient's Chart, lab work & pertinent test results  Airway Mallampati: II  TM Distance: >3 FB Neck ROM: Full    Dental no notable dental hx. (+) Teeth Intact   Pulmonary neg pulmonary ROS,    Pulmonary exam normal breath sounds clear to auscultation       Cardiovascular negative cardio ROS Normal cardiovascular exam Rhythm:Regular Rate:Normal     Neuro/Psych negative neurological ROS  negative psych ROS   GI/Hepatic Neg liver ROS, GERD  ,  Endo/Other  negative endocrine ROS  Renal/GU negative Renal ROS  negative genitourinary   Musculoskeletal negative musculoskeletal ROS (+)   Abdominal   Peds  Hematology negative hematology ROS (+)   Anesthesia Other Findings   Reproductive/Obstetrics (+) Pregnancy Hx/o shoulder dystocia with previous pregnancy                             Anesthesia Physical Anesthesia Plan  ASA: II  Anesthesia Plan: Epidural   Post-op Pain Management:    Induction:   PONV Risk Score and Plan:   Airway Management Planned: Natural Airway  Additional Equipment:   Intra-op Plan:   Post-operative Plan:   Informed Consent: I have reviewed the patients History and Physical, chart, labs and discussed the procedure including the risks, benefits and alternatives for the proposed anesthesia with the patient or authorized representative who has indicated his/her understanding and acceptance.     Plan Discussed with: Anesthesiologist  Anesthesia Plan Comments: (Patient fully dilated and delivered before procedure. No procedure performed.)       Anesthesia Quick Evaluation

## 2017-09-04 NOTE — H&P (Signed)
Christina Wilkerson is a 41 y.o. female presenting for SROM/SOL.  No vb.  Pregnancy uncomplicted.  OB History    Gravida Para Term Preterm AB Living   SAB TAB Ectopic Multiple Live Births         0 3     Past Medical History:  Diagnosis Date  . Medical history non-contributory    Past Surgical History:  Procedure Laterality Date  . NO PAST SURGERIES     Family History: family history includes Cancer in her paternal grandfather; Heart attack in her father. Social History:  reports that she has never smoked. She has never used smokeless tobacco. She reports that she does not drink alcohol or use drugs.     Maternal Diabetes: No Genetic Screening: Abnormal:  Results: Other: increased DSR with quad - normal cell free fetal DNA Maternal Ultrasounds/Referrals: Normal Fetal Ultrasounds or other Referrals:  None Maternal Substance Abuse:  No Significant Maternal Medications:  None Significant Maternal Lab Results:  None Other Comments:  None  ROS History Dilation: 7.5 Blood pressure 125/73, pulse 100, temperature 98.2 F (36.8 C), temperature source Oral, resp. rate 18, height  (1.6 m), weight 165 lb (74.8 kg), last menstrual period 11/28/2016, unknown if currently breastfeeding. Exam Physical Exam  Gen- NAD Abd - gravid, NT Ext - no edema  Prenatal labs: ABO, Rh: O/Positive/-- (02/19 0000) Antibody: Negative (02/19 0000) Rubella: Immune (02/19 0000) RPR: Nonreactive (02/19 0000)  HBsAg: Negative (02/19 0000)  HIV: Non-reactive (02/19 0000)  GBS: Negative (09/20 0000)   Assessment/Plan: Admit    Christina Wilkerson 09/04/2017, 5:48 AM

## 2017-09-04 NOTE — Progress Notes (Signed)
To 164 from Triage via w/c

## 2017-09-04 NOTE — Progress Notes (Signed)
SVD of vigorous female infant w/ apgars of 8,9.  Placenta delivered spontaneous w/ 3VC.   keft vaginal sidewall lac repaired w/ 3-0 vicryl.  Fundus firm.  EBL 250cc .

## 2017-09-05 LAB — CBC
HEMATOCRIT: 32.4 % — AB (ref 36.0–46.0)
HEMOGLOBIN: 11 g/dL — AB (ref 12.0–15.0)
MCH: 30.6 pg (ref 26.0–34.0)
MCHC: 34 g/dL (ref 30.0–36.0)
MCV: 90 fL (ref 78.0–100.0)
Platelets: 158 10*3/uL (ref 150–400)
RBC: 3.6 MIL/uL — ABNORMAL LOW (ref 3.87–5.11)
RDW: 13.5 % (ref 11.5–15.5)
WBC: 8.5 10*3/uL (ref 4.0–10.5)

## 2017-09-05 LAB — BIRTH TISSUE RECOVERY COLLECTION (PLACENTA DONATION)

## 2017-09-05 NOTE — Lactation Note (Signed)
This note was copied from a baby's chart. Lactation Consultation Note  Patient Name: Christina Wilkerson ZOXWR'U Date: 09/05/2017 Reason for consult: Follow-up assessment  Mom says infant is "doing great." She has no questions or concerns.   Lurline Hare [facility] 09/05/2017, 5:43 PM

## 2017-09-05 NOTE — Progress Notes (Signed)
Post Partum Day 1 Subjective: no complaints, up ad lib, voiding and tolerating PO  Objective: Blood pressure 113/71, pulse 64, temperature 97.8 F (36.6 C), temperature source Oral, resp. rate 16, height  (1.6 m), weight 165 lb (74.8 kg), last menstrual period 11/28/2016, SpO2 100 %, unknown if currently breastfeeding.  Physical Exam:  General: alert, cooperative, appears stated age and no distress Lochia: appropriate Uterine Fundus: firm Incision: healing well DVT Evaluation: No evidence of DVT seen on physical exam.   Recent Labs  09/04/17 0517 09/05/17 0536  HGB 12.7 11.0*  HCT 36.6 32.4*    Assessment/Plan: Plan for discharge tomorrow, Breastfeeding and Circumcision prior to discharge   LOS: 1 day   Christina Wilkerson C 09/05/2017, 8:41 AM

## 2017-09-06 NOTE — Discharge Summary (Signed)
Obstetric Discharge Summary Reason for Admission: onset of labor Prenatal Procedures: none Intrapartum Procedures: spontaneous vaginal delivery Postpartum Procedures: none Complications-Operative and Postpartum: none Hemoglobin  Date Value Ref Range Status  09/05/2017 11.0 (L) 12.0 - 15.0 g/dL Final   HCT  Date Value Ref Range Status  09/05/2017 32.4 (L) 36.0 - 46.0 % Final    Physical Exam:  General: alert Lochia: appropriate Uterine Fundus: firm Incision: healing well DVT Evaluation: No evidence of DVT seen on physical exam.  Discharge Diagnoses: Term Pregnancy-delivered  Discharge Information: Date: 09/06/2017 Activity: pelvic rest Diet: routine Medications: PNV Condition: stable Instructions: refer to practice specific booklet Discharge to: home Follow-up Information    [place] Schedule an appointment as soon as possible for a visit in 6 week(s).   Contact information: [address] [place] [address] [phone]           Newborn Data: Live born female  Birth Weight: 8 lb 6.2 oz (3805 g) APGAR: 8, 9  Home with mother.  Christina Wilkerson M 09/06/2017, 8:30 AM

## 2017-09-09 ENCOUNTER — Encounter (HOSPITAL_COMMUNITY): Payer: 59

## 2020-08-04 LAB — OB RESULTS CONSOLE ABO/RH: RH Type: POSITIVE

## 2020-08-04 LAB — OB RESULTS CONSOLE GC/CHLAMYDIA
Chlamydia: NEGATIVE
Gonorrhea: NEGATIVE

## 2020-08-04 LAB — OB RESULTS CONSOLE ANTIBODY SCREEN: Antibody Screen: NEGATIVE

## 2020-08-04 LAB — OB RESULTS CONSOLE HIV ANTIBODY (ROUTINE TESTING): HIV: NONREACTIVE

## 2020-08-04 LAB — OB RESULTS CONSOLE HEPATITIS B SURFACE ANTIGEN: Hepatitis B Surface Ag: NEGATIVE

## 2020-08-04 LAB — OB RESULTS CONSOLE RUBELLA ANTIBODY, IGM: Rubella: IMMUNE

## 2020-08-04 LAB — OB RESULTS CONSOLE RPR: RPR: NONREACTIVE

## 2020-12-13 NOTE — L&D Delivery Note (Signed)
Delivery Note At 8:56 AM a viable female was delivered via Vaginal, Spontaneous (Presentation: Left Occiput Anterior).  APGAR: 8, 9; weight  .   Placenta status: Spontaneous, Intact.  Cord: 3 vessels with the following complications: None.  Cord pH: not ontained  Anesthesia: Local Episiotomy: None Lacerations: 2nd degree Suture Repair: 3.0 chromic Est. Blood Loss (mL): 300  Mom to postpartum.  Baby to Couplet care / Skin to Skin.  Jeani Hawking 02/06/2021, 9:10 AM

## 2021-01-12 LAB — OB RESULTS CONSOLE GBS: GBS: POSITIVE

## 2021-01-23 ENCOUNTER — Telehealth (HOSPITAL_COMMUNITY): Payer: Self-pay | Admitting: *Deleted

## 2021-01-23 ENCOUNTER — Encounter (HOSPITAL_COMMUNITY): Payer: Self-pay | Admitting: *Deleted

## 2021-01-23 NOTE — Telephone Encounter (Signed)
Preadmission screen  

## 2021-01-27 ENCOUNTER — Telehealth (HOSPITAL_COMMUNITY): Payer: Self-pay | Admitting: *Deleted

## 2021-01-27 NOTE — Telephone Encounter (Signed)
Preadmission screen  

## 2021-01-28 ENCOUNTER — Telehealth (HOSPITAL_COMMUNITY): Payer: Self-pay | Admitting: *Deleted

## 2021-01-28 NOTE — Telephone Encounter (Signed)
Preadmission screen  

## 2021-01-29 ENCOUNTER — Telehealth (HOSPITAL_COMMUNITY): Payer: Self-pay | Admitting: *Deleted

## 2021-01-29 ENCOUNTER — Encounter (HOSPITAL_COMMUNITY): Payer: Self-pay | Admitting: *Deleted

## 2021-01-29 NOTE — Telephone Encounter (Signed)
Preadmission screen  

## 2021-02-04 ENCOUNTER — Other Ambulatory Visit (HOSPITAL_COMMUNITY)
Admission: RE | Admit: 2021-02-04 | Discharge: 2021-02-04 | Disposition: A | Discharge status: Home / Self Care | Payer: 59 | Source: Ambulatory Visit | Attending: Obstetrics and Gynecology | Admitting: Obstetrics and Gynecology

## 2021-02-04 DIAGNOSIS — Z20822 Contact with and (suspected) exposure to covid-19: Secondary | ICD-10-CM | POA: Insufficient documentation

## 2021-02-04 DIAGNOSIS — Z01818 Encounter for other preprocedural examination: Secondary | ICD-10-CM | POA: Insufficient documentation

## 2021-02-04 LAB — SARS CORONAVIRUS 2 (TAT 6-24 HRS): SARS Coronavirus 2: NEGATIVE

## 2021-02-05 ENCOUNTER — Other Ambulatory Visit: Payer: Self-pay

## 2021-02-06 ENCOUNTER — Encounter (HOSPITAL_COMMUNITY): Payer: Self-pay | Admitting: Obstetrics and Gynecology

## 2021-02-06 ENCOUNTER — Inpatient Hospital Stay (HOSPITAL_COMMUNITY): Payer: 59

## 2021-02-06 ENCOUNTER — Inpatient Hospital Stay (HOSPITAL_COMMUNITY)
Admission: AD | Admit: 2021-02-06 | Discharge: 2021-02-08 | DRG: 807 | Disposition: A | Discharge status: Home / Self Care | Payer: 59 | Attending: Obstetrics and Gynecology | Admitting: Obstetrics and Gynecology

## 2021-02-06 DIAGNOSIS — Z3A40 40 weeks gestation of pregnancy: Secondary | ICD-10-CM

## 2021-02-06 DIAGNOSIS — Z20822 Contact with and (suspected) exposure to covid-19: Secondary | ICD-10-CM | POA: Diagnosis present

## 2021-02-06 DIAGNOSIS — O99824 Streptococcus B carrier state complicating childbirth: Principal | ICD-10-CM | POA: Diagnosis present

## 2021-02-06 DIAGNOSIS — O26893 Other specified pregnancy related conditions, third trimester: Secondary | ICD-10-CM | POA: Diagnosis present

## 2021-02-06 DIAGNOSIS — Z3490 Encounter for supervision of normal pregnancy, unspecified, unspecified trimester: Secondary | ICD-10-CM

## 2021-02-06 LAB — CBC
HCT: 32.6 % — ABNORMAL LOW (ref 36.0–46.0)
Hemoglobin: 10.8 g/dL — ABNORMAL LOW (ref 12.0–15.0)
MCH: 27.8 pg (ref 26.0–34.0)
MCHC: 33.1 g/dL (ref 30.0–36.0)
MCV: 84 fL (ref 80.0–100.0)
Platelets: 231 10*3/uL (ref 150–400)
RBC: 3.88 MIL/uL (ref 3.87–5.11)
RDW: 13.2 % (ref 11.5–15.5)
WBC: 8.7 10*3/uL (ref 4.0–10.5)
nRBC: 0 % (ref 0.0–0.2)

## 2021-02-06 LAB — TYPE AND SCREEN
ABO/RH(D): O POS
Antibody Screen: NEGATIVE

## 2021-02-06 LAB — RPR: RPR Ser Ql: NONREACTIVE

## 2021-02-06 MED ORDER — FLEET ENEMA 7-19 GM/118ML RE ENEM: 1.0000 | ENEMA | Freq: Every day | RECTAL | Status: DC | PRN

## 2021-02-06 MED ORDER — ACETAMINOPHEN 325 MG PO TABS: 650.0000 mg | ORAL_TABLET | ORAL | Status: DC | PRN

## 2021-02-06 MED ORDER — OXYCODONE HCL 5 MG PO TABS: 10.0000 mg | ORAL_TABLET | ORAL | Status: DC | PRN

## 2021-02-06 MED ORDER — IBUPROFEN 600 MG PO TABS
600.0000 mg | ORAL_TABLET | Freq: Four times a day (QID) | ORAL | Status: DC
  Administered 2021-02-06 – 2021-02-08 (×8): 600 mg via ORAL
  Filled 2021-02-06 (×9): qty 1

## 2021-02-06 MED ORDER — LACTATED RINGERS IV SOLN: 500.0000 mL | INTRAVENOUS | Status: DC | PRN

## 2021-02-06 MED ORDER — OXYCODONE HCL 5 MG PO TABS: 5.0000 mg | ORAL_TABLET | ORAL | Status: DC | PRN

## 2021-02-06 MED ORDER — WITCH HAZEL-GLYCERIN EX PADS: 1.0000 "application " | MEDICATED_PAD | CUTANEOUS | Status: DC | PRN

## 2021-02-06 MED ORDER — ONDANSETRON HCL 4 MG/2ML IJ SOLN: 4.0000 mg | Freq: Four times a day (QID) | INTRAMUSCULAR | Status: DC | PRN

## 2021-02-06 MED ORDER — OXYTOCIN-SODIUM CHLORIDE 30-0.9 UT/500ML-% IV SOLN
2.5000 [IU]/h | INTRAVENOUS | Status: DC
  Administered 2021-02-06: 2.5 [IU]/h via INTRAVENOUS

## 2021-02-06 MED ORDER — ONDANSETRON HCL 4 MG PO TABS: 4.0000 mg | ORAL_TABLET | ORAL | Status: DC | PRN

## 2021-02-06 MED ORDER — ONDANSETRON HCL 4 MG/2ML IJ SOLN: 4.0000 mg | INTRAMUSCULAR | Status: DC | PRN

## 2021-02-06 MED ORDER — OXYCODONE-ACETAMINOPHEN 5-325 MG PO TABS: 1.0000 | ORAL_TABLET | ORAL | Status: DC | PRN

## 2021-02-06 MED ORDER — TERBUTALINE SULFATE 1 MG/ML IJ SOLN: 0.2500 mg | Freq: Once | INTRAMUSCULAR | Status: DC | PRN

## 2021-02-06 MED ORDER — OXYCODONE-ACETAMINOPHEN 5-325 MG PO TABS: 2.0000 | ORAL_TABLET | ORAL | Status: DC | PRN

## 2021-02-06 MED ORDER — OXYTOCIN-SODIUM CHLORIDE 30-0.9 UT/500ML-% IV SOLN
1.0000 m[IU]/min | INTRAVENOUS | Status: DC
  Filled 2021-02-06: qty 500

## 2021-02-06 MED ORDER — TETANUS-DIPHTH-ACELL PERTUSSIS 5-2.5-18.5 LF-MCG/0.5 IM SUSY: 0.5000 mL | PREFILLED_SYRINGE | Freq: Once | INTRAMUSCULAR | Status: DC

## 2021-02-06 MED ORDER — SENNOSIDES-DOCUSATE SODIUM 8.6-50 MG PO TABS
2.0000 | ORAL_TABLET | Freq: Every day | ORAL | Status: DC
  Administered 2021-02-07: 2 via ORAL
  Filled 2021-02-06 (×2): qty 2

## 2021-02-06 MED ORDER — ZOLPIDEM TARTRATE 5 MG PO TABS: 5.0000 mg | ORAL_TABLET | Freq: Every evening | ORAL | Status: DC | PRN

## 2021-02-06 MED ORDER — MISOPROSTOL 25 MCG QUARTER TABLET
25.0000 ug | ORAL_TABLET | ORAL | Status: DC | PRN
  Administered 2021-02-06 (×2): 25 ug via VAGINAL
  Filled 2021-02-06 (×2): qty 1

## 2021-02-06 MED ORDER — MEDROXYPROGESTERONE ACETATE 150 MG/ML IM SUSP: 150.0000 mg | INTRAMUSCULAR | Status: DC | PRN

## 2021-02-06 MED ORDER — SIMETHICONE 80 MG PO CHEW: 80.0000 mg | CHEWABLE_TABLET | ORAL | Status: DC | PRN

## 2021-02-06 MED ORDER — DIBUCAINE (PERIANAL) 1 % EX OINT: 1.0000 "application " | TOPICAL_OINTMENT | CUTANEOUS | Status: DC | PRN

## 2021-02-06 MED ORDER — OXYTOCIN BOLUS FROM INFUSION
333.0000 mL | Freq: Once | INTRAVENOUS | Status: AC
  Administered 2021-02-06: 333 mL via INTRAVENOUS

## 2021-02-06 MED ORDER — LACTATED RINGERS IV SOLN: INTRAVENOUS | Status: DC

## 2021-02-06 MED ORDER — PRENATAL MULTIVITAMIN CH
1.0000 | ORAL_TABLET | Freq: Every day | ORAL | Status: DC
  Administered 2021-02-07: 1 via ORAL
  Filled 2021-02-06 (×2): qty 1

## 2021-02-06 MED ORDER — SODIUM CHLORIDE 0.9 % IV SOLN
5.0000 10*6.[IU] | Freq: Once | INTRAVENOUS | Status: AC
  Administered 2021-02-06: 5 10*6.[IU] via INTRAVENOUS
  Filled 2021-02-06: qty 5

## 2021-02-06 MED ORDER — BENZOCAINE-MENTHOL 20-0.5 % EX AERO
1.0000 "application " | INHALATION_SPRAY | CUTANEOUS | Status: DC | PRN
  Administered 2021-02-06: 1 via TOPICAL
  Filled 2021-02-06: qty 56

## 2021-02-06 MED ORDER — MEASLES, MUMPS & RUBELLA VAC IJ SOLR: 0.5000 mL | Freq: Once | INTRAMUSCULAR | Status: DC

## 2021-02-06 MED ORDER — COCONUT OIL OIL: 1.0000 "application " | TOPICAL_OIL | Status: DC | PRN

## 2021-02-06 MED ORDER — PENICILLIN G POT IN DEXTROSE 60000 UNIT/ML IV SOLN
3.0000 10*6.[IU] | INTRAVENOUS | Status: DC
  Administered 2021-02-06 (×2): 3 10*6.[IU] via INTRAVENOUS
  Filled 2021-02-06 (×2): qty 50

## 2021-02-06 MED ORDER — BISACODYL 10 MG RE SUPP: 10.0000 mg | Freq: Every day | RECTAL | Status: DC | PRN

## 2021-02-06 MED ORDER — DIPHENHYDRAMINE HCL 25 MG PO CAPS: 25.0000 mg | ORAL_CAPSULE | Freq: Four times a day (QID) | ORAL | Status: DC | PRN

## 2021-02-06 MED ORDER — LIDOCAINE HCL (PF) 1 % IJ SOLN
30.0000 mL | INTRAMUSCULAR | Status: AC | PRN
  Administered 2021-02-06: 30 mL via SUBCUTANEOUS
  Filled 2021-02-06: qty 30

## 2021-02-06 MED ORDER — SOD CITRATE-CITRIC ACID 500-334 MG/5ML PO SOLN: 30.0000 mL | ORAL | Status: DC | PRN

## 2021-02-06 NOTE — H&P (Signed)
Christina Wilkerson is a 45 year old G 5 P 4 at 40 weeks presented for IOL - elective  And history of rapid labor. Now status post cytotec and epidural. OB History    Gravida  5   Para  4   Term  4   Preterm      AB      Living  4     SAB      IAB      Ectopic      Multiple  0   Live Births  4          Past Medical History:  Diagnosis Date  . Medical history non-contributory    Past Surgical History:  Procedure Laterality Date  . NO PAST SURGERIES     Family History: family history includes Cancer in her paternal grandfather; Heart attack in her father. Social History:  reports that she has never smoked. She has never used smokeless tobacco. She reports that she does not drink alcohol and does not use drugs.     Maternal Diabetes: No Genetic Screening: Normal Maternal Ultrasounds/Referrals: Normal Fetal Ultrasounds or other Referrals:  None Maternal Substance Abuse:  No Significant Maternal Medications:  None Significant Maternal Lab Results:  None Other Comments:  None  Review of Systems  All other systems reviewed and are negative.  Maternal Medical History:  Prenatal complications: no prenatal complications   Dilation: 5 Effacement (%): 90 Station: -1 Exam by:: Alexis [place] RN Blood pressure 129/79, pulse 98, temperature 98.5 F (36.9 C), temperature source Oral, resp. rate 20, height 5\' 3"  (1.6 m), weight 74.5 kg, SpO2 97 %, unknown if currently breastfeeding. Maternal Exam:  Abdomen: Fetal presentation: vertex     Fetal Exam Fetal State Assessment: Category I - tracings are normal.     Physical Exam Vitals and nursing note reviewed. Exam conducted with a chaperone present.  Constitutional:      Appearance: Normal appearance.  Eyes:     Pupils: Pupils are equal, round, and reactive to light.  Cardiovascular:     Rate and Rhythm: Normal rate and regular rhythm.  Neurological:     Mental Status: She is alert.     Prenatal  labs: ABO, Rh: --/--/O POS (02/25 0040) Antibody: NEG (02/25 0040) Rubella: Immune (08/23 0000) RPR: Nonreactive (08/23 0000)  HBsAg: Negative (08/23 0000)  HIV: Non-reactive (08/23 0000)  GBS: Positive/-- (01/31 0000)   Assessment/Plan: IUP at 40 weeks IOL Anticipate NSVD  03-10-1971 02/06/2021, 7:39 AM

## 2021-02-06 NOTE — Lactation Note (Signed)
This note was copied from a baby's chart. Lactation Consultation Note  Patient Name: Christina Wilkerson Date: 02/06/2021   Age:43 hours  Mom declined lactation per RN, Alphonzo Severance.   Maternal Data    Feeding    LATCH Score                    Lactation Tools Discussed/Used    Interventions    Discharge    Consult Status      Christina Wilkerson  Christina Wilkerson 02/06/2021, 3:44 PM

## 2021-02-07 LAB — CBC
HCT: 28.1 % — ABNORMAL LOW (ref 36.0–46.0)
Hemoglobin: 9.5 g/dL — ABNORMAL LOW (ref 12.0–15.0)
MCH: 28.7 pg (ref 26.0–34.0)
MCHC: 33.8 g/dL (ref 30.0–36.0)
MCV: 84.9 fL (ref 80.0–100.0)
Platelets: 190 10*3/uL (ref 150–400)
RBC: 3.31 MIL/uL — ABNORMAL LOW (ref 3.87–5.11)
RDW: 13.5 % (ref 11.5–15.5)
WBC: 7.4 10*3/uL (ref 4.0–10.5)
nRBC: 0 % (ref 0.0–0.2)

## 2021-02-07 NOTE — Progress Notes (Signed)
PPD #1  Doing well no complaints  BP 103/65 (BP Location: Right Arm)   Pulse 68   Temp 97.9 F (36.6 C) (Oral)   Resp 16   Ht 5\' 3"  (1.6 m)   Wt 74.5 kg   SpO2 98%   Breastfeeding Unknown   BMI 29.10 kg/m  Results for orders placed or performed during the hospital encounter of 02/06/21 (from the past 24 hour(s))  CBC     Status: Abnormal   Collection Time: 02/07/21  5:17 AM  Result Value Ref Range   WBC 7.4 4.0 - 10.5 K/uL   RBC 3.31 (L) 3.87 - 5.11 MIL/uL   Hemoglobin 9.5 (L) 12.0 - 15.0 g/dL   HCT 02/09/21 (L) 17.4 - 94.4 %   MCV 84.9 80.0 - 100.0 fL   MCH 28.7 26.0 - 34.0 pg   MCHC 33.8 30.0 - 36.0 g/dL   RDW 96.7 59.1 - 63.8 %   Platelets 190 150 - 400 K/uL   nRBC 0.0 0.0 - 0.2 %   Abdomen soft and non tender Lochia WNL  PPD # 1  Doing well Routine care Discharge home tomorrow

## 2021-02-07 NOTE — Lactation Note (Signed)
This note was copied from a baby's chart. Lactation Consultation Note  Patient Name: Boy Lovette Merta HKFEX'M Date: 02/07/2021   Age:43 hours  LC Visit:  Per RN, mother does not desire a lactation consult.   Maternal Data    Feeding    LATCH Score                    Lactation Tools Discussed/Used    Interventions    Discharge    Consult Status      Lashara Urey R Kam Kushnir 02/07/2021, 4:07 PM

## 2021-02-08 LAB — RUBELLA SCREEN: Rubella: 1.38 index (ref >0.99)

## 2021-02-08 NOTE — Progress Notes (Signed)
Reviewed discharge postpartum instructions with patient regarding medications, when to call MD/go to [facility], signs of symptoms of pre-e, and to follow up with OB in 4-6 weeks for postpartum check. Patient verbalized understanding of discharge postpartum instructions and left ambulatory with significant other and infant.

## 2021-02-08 NOTE — Discharge Summary (Signed)
Postpartum Discharge Summary  Date of Service February 08, 2021     Patient Name: Christina Wilkerson DOB: 1976/06/16 MRN: [id number]  Date of admission: 02/06/2021 Delivery date:02/06/2021  Delivering provider: Dian Queen  Date of discharge: 02/08/2021  Admitting diagnosis: Pregnancy [Z34.90] NSVD (normal spontaneous vaginal delivery) [O80] Intrauterine pregnancy: [redacted]w[redacted]d    Secondary diagnosis:  Active Problems:   Pregnancy   NSVD (normal spontaneous vaginal delivery)  Additional problems: not applicable     Discharge diagnosis: Term Pregnancy Delivered                                              Post partum procedures:not applicable Augmentation: AROM and Cytotec Complications: None  Hospital course: Induction of Labor With Vaginal Delivery   45y.o. yo G5P5005 at 446w0das admitted to the hospital 02/06/2021 for induction of labor.  Indication for induction: Favorable cervix at term.  Patient had an uncomplicated labor course as follows: Membrane Rupture Time/Date: 7:38 PM ,02/06/2021   Delivery Method:Vaginal, Spontaneous  Episiotomy: None  Lacerations:  2nd degree  Details of delivery can be found in separate delivery note.  Patient had a routine postpartum course. Patient is discharged home 02/08/21.  Newborn Data: Birth date:02/06/2021  Birth time:8:56 AM  Gender:Female  Living status:Living  Apgars:8 ,9  Weight:3440 g   Magnesium Sulfate received: No BMZ received: No Rhophylac:No MMR:No T-DaP:Given prenatally Flu: Yes Transfusion:No  Physical exam  Vitals:   02/07/21 0515 02/07/21 1428 02/07/21 2156 02/08/21 0506  BP: 103/65 102/61 117/71 102/66  Pulse: 68 73 86 83  Resp: 16 16 18 18   Temp: 97.9 F (36.6 C) 98.2 F (36.8 C) 98.2 F (36.8 C) 98.2 F (36.8 C)  TempSrc: Oral Oral Oral Oral  SpO2: 98% 99%  100%  Weight:      Height:       General: alert, cooperative and no distress Lochia: appropriate Uterine Fundus: firm Incision: Healing well  with no significant drainage DVT Evaluation: No evidence of DVT seen on physical exam. Labs: Lab Results  Component Value Date   WBC 7.4 02/07/2021   HGB 9.5 (L) 02/07/2021   HCT 28.1 (L) 02/07/2021   MCV 84.9 02/07/2021   PLT 190 02/07/2021   No flowsheet data found. Edinburgh Score: Edinburgh Postnatal Depression Scale Screening Tool 02/07/2021  I have been able to laugh and see the funny side of things. 0  I have looked forward with enjoyment to things. 0  I have blamed myself unnecessarily when things went wrong. 0  I have been anxious or worried for no good reason. 0  I have felt scared or panicky for no good reason. 0  Things have been getting on top of me. 0  I have been so unhappy that I have had difficulty sleeping. 0  I have felt sad or miserable. 0  I have been so unhappy that I have been crying. 0  The thought of harming myself has occurred to me. 0  Edinburgh Postnatal Depression Scale Total 0      After visit meds:  Allergies as of 02/08/2021   No Known Allergies     Medication List    TAKE these medications   prenatal multivitamin Tabs tablet Take 1 tablet by mouth daily at 12 noon.        Discharge home in stable condition  Infant Feeding: Breast Infant Disposition:home with mother Discharge instruction: per After Visit Summary and Postpartum booklet. Activity: Advance as tolerated. Pelvic rest for 6 weeks.  Diet: routine diet Anticipated Birth Control: Unsure Postpartum Appointment:6 weeks Additional Postpartum F/U: not applicable Future Appointments:No future appointments. Follow up Visit:      02/08/2021 Cyril Mourning, MD

## 2024-01-27 ENCOUNTER — Ambulatory Visit (AMBULATORY_SURGERY_CENTER): Payer: 59

## 2024-01-27 VITALS — Ht 64.0 in | Wt 128.0 lb

## 2024-01-27 DIAGNOSIS — Z1211 Encounter for screening for malignant neoplasm of colon: Secondary | ICD-10-CM

## 2024-01-27 MED ORDER — SUFLAVE 178.7 G PO SOLR: 1.0000 | Freq: Once | ORAL | 0 refills | Status: AC

## 2024-01-27 NOTE — Progress Notes (Signed)
No egg or soy allergy known to patient  No issues known to pt with past sedation with any surgeries or procedures Patient denies ever being told they had issues or difficulty with intubation  No FH of Malignant Hyperthermia Pt is not on diet pills Pt is not on  home 02  Pt is not on blood thinners  Pt denies issues with constipation  No A fib or A flutter Have any cardiac testing pending--no Pt can ambulate independently Pt denies use of chewing tobacco Discussed diabetic I weight loss medication holds Discussed NSAID holds Checked BMI Pt instructed to use Single[employer] or GoodRx for a price reduction on prep  Patient's chart reviewed by Christina Wilkerson CNRA prior to previsit and patient appropriate for the [facility].  Pre visit completed and red dot placed by patient's name on their procedure day (on provider's schedule).

## 2024-02-06 ENCOUNTER — Encounter: Payer: Self-pay | Admitting: Gastroenterology

## 2024-02-09 ENCOUNTER — Ambulatory Visit: Payer: 59 | Admitting: Gastroenterology

## 2024-02-09 ENCOUNTER — Encounter: Payer: Self-pay | Admitting: Gastroenterology

## 2024-02-09 VITALS — BP 144/87 | HR 69 | Temp 98.6°F | Resp 14 | Ht 64.0 in | Wt 128.0 lb

## 2024-02-09 DIAGNOSIS — K648 Other hemorrhoids: Secondary | ICD-10-CM

## 2024-02-09 DIAGNOSIS — D122 Benign neoplasm of ascending colon: Secondary | ICD-10-CM | POA: Diagnosis not present

## 2024-02-09 DIAGNOSIS — D124 Benign neoplasm of descending colon: Secondary | ICD-10-CM | POA: Diagnosis not present

## 2024-02-09 DIAGNOSIS — D123 Benign neoplasm of transverse colon: Secondary | ICD-10-CM

## 2024-02-09 DIAGNOSIS — Q439 Congenital malformation of intestine, unspecified: Secondary | ICD-10-CM | POA: Diagnosis not present

## 2024-02-09 DIAGNOSIS — Z1211 Encounter for screening for malignant neoplasm of colon: Secondary | ICD-10-CM | POA: Diagnosis present

## 2024-02-09 MED ORDER — SODIUM CHLORIDE 0.9 % IV SOLN: 500.0000 mL | INTRAVENOUS | Status: DC

## 2024-02-09 NOTE — Patient Instructions (Signed)

## 2024-02-09 NOTE — Progress Notes (Signed)
 [facility] History and Physical   Primary Care Physician:  Patient, No Pcp Per   Reason for Procedure:   Colon cancer screening  Plan:    colonoscopy     HPI: Christina Wilkerson is a 48 y.o. female  here for colonoscopy screening - first time exam.   Patient denies any bowel symptoms at this time. No family history of colon cancer known. Otherwise feels well without any cardiopulmonary symptoms.   I have discussed risks / benefits of anesthesia and endoscopic procedure with Cyd Silence and they wish to proceed with the exams as outlined today.    Past Medical History:  Diagnosis Date   Medical history non-contributory     Past Surgical History:  Procedure Laterality Date   NO PAST SURGERIES      Prior to Admission medications   Medication Sig Start Date End Date Taking? Authorizing Provider  Norethindrone Acetate-Ethinyl Estrad-FE (HAILEY 24 FE) 1-20 MG-MCG(24) tablet Take 1 tablet every day by oral route.   Yes [provider]    Current Outpatient Medications  Medication Sig Dispense Refill   Norethindrone Acetate-Ethinyl Estrad-FE (HAILEY 24 FE) 1-20 MG-MCG(24) tablet Take 1 tablet every day by oral route.     Current Facility-Administered Medications  Medication Dose Route Frequency Provider Last Rate Last Admin   0.9 %  sodium chloride infusion  500 mL Intravenous Continuous Aldrin Engelhard, Willaim Rayas, MD        Allergies as of 02/09/2024   (No Known Allergies)    Family History  Problem Relation Age of Onset   Heart attack Father    Cancer Paternal Grandfather        lung   Colon cancer Neg Hx    Colon polyps Neg Hx    Esophageal cancer Neg Hx    Rectal cancer Neg Hx    Stomach cancer Neg Hx     Social History   Socioeconomic History   Marital status: Married    Spouse name: Not on file   Number of children: Not on file   Years of education: Not on file   Highest education level: Not on file  Occupational History   Not on file   Tobacco Use   Smoking status: Never   Smokeless tobacco: Never  Vaping Use   Vaping status: Never Used  Substance and Sexual Activity   Alcohol use: No   Drug use: No   Sexual activity: Yes  Other Topics Concern   Not on file  Social History Narrative   Not on file   Social Drivers of Health   Financial Resource Strain: Not on file  Food Insecurity: Low Risk  (04/11/2023)   Received from [facility]   Hunger Vital Sign    Worried About Running Out of Food in the Last Year: Never true    Ran Out of Food in the Last Year: Never true  Transportation Needs: Not on file (04/11/2023)  Physical Activity: Not on file  Stress: Not on file (10/21/2023)  Social Connections: Not on file  Intimate Partner Violence: Low Risk  (09/05/2021)   Received from [facility]   Intimate Partner Violence    Insults You: Not on file    Threatens You: Not on file    Screams at You: Not on file    Physically Hurt: Not on file    Intimate Partner Violence Score: Not on file    Review of Systems: All other review of systems negative except as  mentioned in the HPI.  Physical Exam: Vital signs BP 137/72   Pulse 100   Temp 98.6 F (37 C)   Ht 5\' 4"  (1.626 m)   Wt 128 lb (58.1 kg)   SpO2 100%   BMI 21.97 kg/m   General:   Alert,  Well-developed, pleasant and cooperative in NAD Lungs:  Clear throughout to auscultation.   Heart:  Regular rate and rhythm Abdomen:  Soft, nontender and nondistended.   Neuro/Psych:  Alert and cooperative. Normal mood and affect. A and O x 3  Harlin Rain, MD [facility]

## 2024-02-09 NOTE — Op Note (Signed)
 [facility] Patient Name: Christina Wilkerson Procedure Date: 02/09/2024 10:44 AM MRN: [id number] Endoscopist: Viviann Spare P. Adela Lank , MD, [id number] Age: 48 Referring MD:  Date of Birth: 10-Nov-1976 Gender: Female Account #: [id number] Procedure:                Colonoscopy Indications:              Screening for colorectal malignant neoplasm, This                            is the patient's first colonoscopy Medicines:                Monitored Anesthesia Care Procedure:                Pre-Anesthesia Assessment:                           - Prior to the procedure, a History and Physical                            was performed, and patient medications and                            allergies were reviewed. The patient's tolerance of                            previous anesthesia was also reviewed. The risks                            and benefits of the procedure and the sedation                            options and risks were discussed with the patient.                            All questions were answered, and informed consent                            was obtained. Prior Anticoagulants: The patient has                            taken no anticoagulant or antiplatelet agents. ASA                            Grade Assessment: I - A normal, healthy patient.                            After reviewing the risks and benefits, the patient                            was deemed in satisfactory condition to undergo the                            procedure.  After obtaining informed consent, the colonoscope                            was passed under direct vision. Throughout the                            procedure, the patient's blood pressure, pulse, and                            oxygen saturations were monitored continuously. The                            Olympus Scope [id number] was introduced through the                            anus and advanced to the the  cecum, identified by                            appendiceal orifice and ileocecal valve. The                            colonoscopy was performed without difficulty. The                            patient tolerated the procedure well. The quality                            of the bowel preparation was good. The ileocecal                            valve, appendiceal orifice, and rectum were                            photographed. Scope In: 10:48:44 AM Scope Out: 11:11:23 AM Scope Withdrawal Time: 0 hours 15 minutes 5 seconds  Total Procedure Duration: 0 hours 22 minutes 39 seconds  Findings:                 The perianal and digital rectal examinations were                            normal.                           A 7 mm polyp was found in the ascending colon. The                            polyp was flat. The polyp was removed with a cold                            snare. Resection and retrieval were complete.                           A 3 mm polyp was found in the transverse colon. The  polyp was flat. The polyp was removed with a cold                            snare. Resection and retrieval were complete.                           A 5 mm polyp was found in the descending colon. The                            polyp was flat. The polyp was removed with a cold                            snare. Resection and retrieval were complete.                           The colon was tortuous.                           Internal hemorrhoids were found during                            retroflexion. The hemorrhoids were small.                           The exam was otherwise without abnormality. Complications:            No immediate complications. Estimated blood loss:                            Minimal. Estimated Blood Loss:     Estimated blood loss was minimal. Impression:               - One 7 mm polyp in the ascending colon, removed                            with a  cold snare. Resected and retrieved.                           - One 3 mm polyp in the transverse colon, removed                            with a cold snare. Resected and retrieved.                           - One 5 mm polyp in the descending colon, removed                            with a cold snare. Resected and retrieved.                           - Tortuous colon.                           - Internal hemorrhoids.                           -  The examination was otherwise normal. Recommendation:           - Patient has a contact number available for                            emergencies. The signs and symptoms of potential                            delayed complications were discussed with the                            patient. Return to normal activities tomorrow.                            Written discharge instructions were provided to the                            patient.                           - Resume previous diet.                           - Continue present medications.                           - Await pathology results. Viviann Spare P. Kypton Eltringham, MD 02/09/2024 11:15:23 AM This report has been signed electronically.

## 2024-02-09 NOTE — Progress Notes (Signed)
 Called to room to assist during endoscopic procedure.  Patient ID and intended procedure confirmed with present staff. Received instructions for my participation in the procedure from the performing physician.

## 2024-02-09 NOTE — Progress Notes (Signed)
 Sedate, gd SR, tolerated procedure well, VSS, report to RN

## 2024-02-10 ENCOUNTER — Telehealth: Payer: Self-pay | Admitting: *Deleted

## 2024-02-10 NOTE — Telephone Encounter (Signed)
 No answer for post procedure call back. Left VM.

## 2024-02-13 LAB — SURGICAL PATHOLOGY

## 2024-02-14 ENCOUNTER — Encounter: Payer: Self-pay | Admitting: Gastroenterology
